# Patient Record
Sex: Female | Born: 1945 | Race: White | Hispanic: No
Health system: Southern US, Community
[De-identification: ages and names within clinical notes are randomized; demographics above are authoritative.]

## PROBLEM LIST (undated history)

## (undated) DIAGNOSIS — K219 Gastro-esophageal reflux disease without esophagitis: Secondary | ICD-10-CM

## (undated) DIAGNOSIS — F32A Depression, unspecified: Secondary | ICD-10-CM

## (undated) DIAGNOSIS — G4700 Insomnia, unspecified: Secondary | ICD-10-CM

## (undated) DIAGNOSIS — M159 Polyosteoarthritis, unspecified: Secondary | ICD-10-CM

## (undated) DIAGNOSIS — E7800 Pure hypercholesterolemia, unspecified: Secondary | ICD-10-CM

## (undated) DIAGNOSIS — N183 Chronic kidney disease, stage 3 unspecified: Secondary | ICD-10-CM

## (undated) DIAGNOSIS — R0989 Other specified symptoms and signs involving the circulatory and respiratory systems: Secondary | ICD-10-CM

## (undated) DIAGNOSIS — Z8619 Personal history of other infectious and parasitic diseases: Secondary | ICD-10-CM

## (undated) DIAGNOSIS — R600 Localized edema: Secondary | ICD-10-CM

## (undated) DIAGNOSIS — D509 Iron deficiency anemia, unspecified: Secondary | ICD-10-CM

## (undated) DIAGNOSIS — G894 Chronic pain syndrome: Secondary | ICD-10-CM

## (undated) DIAGNOSIS — R519 Headache, unspecified: Secondary | ICD-10-CM

## (undated) DIAGNOSIS — I10 Essential (primary) hypertension: Secondary | ICD-10-CM

## (undated) HISTORY — DX: Iron deficiency anemia, unspecified: D50.9

## (undated) HISTORY — DX: Essential (primary) hypertension: I10

## (undated) HISTORY — DX: Polyosteoarthritis, unspecified: M15.9

## (undated) HISTORY — DX: Insomnia, unspecified: G47.00

## (undated) HISTORY — DX: Depression, unspecified: F32.A

## (undated) HISTORY — DX: Headache, unspecified: R51.9

## (undated) HISTORY — DX: Other specified symptoms and signs involving the circulatory and respiratory systems: R09.89

## (undated) HISTORY — DX: Localized edema: R60.0

## (undated) HISTORY — DX: Personal history of other infectious and parasitic diseases: Z86.19

## (undated) HISTORY — PX: ESOPHAGOGASTRODUODENOSCOPY: SHX1529

## (undated) HISTORY — DX: Chronic kidney disease, stage 3 unspecified: N18.30

## (undated) HISTORY — DX: Pure hypercholesterolemia, unspecified: E78.00

## (undated) HISTORY — DX: Gastro-esophageal reflux disease without esophagitis: K21.9

## (undated) HISTORY — PX: OTHER SURGICAL HISTORY: SHX169

## (undated) HISTORY — DX: Chronic pain syndrome: G89.4

---

## 1984-11-06 HISTORY — PX: APPENDECTOMY: SHX54

## 2000-07-21 ENCOUNTER — Emergency Department (HOSPITAL_COMMUNITY): Admission: EM | Admit: 2000-07-21 | Discharge: 2000-07-21 | Payer: Self-pay | Admitting: *Deleted

## 2012-07-04 DIAGNOSIS — F419 Anxiety disorder, unspecified: Secondary | ICD-10-CM | POA: Insufficient documentation

## 2012-07-04 DIAGNOSIS — M549 Dorsalgia, unspecified: Secondary | ICD-10-CM | POA: Insufficient documentation

## 2012-07-04 DIAGNOSIS — D649 Anemia, unspecified: Secondary | ICD-10-CM | POA: Insufficient documentation

## 2012-07-04 DIAGNOSIS — J309 Allergic rhinitis, unspecified: Secondary | ICD-10-CM | POA: Insufficient documentation

## 2015-02-08 DIAGNOSIS — E785 Hyperlipidemia, unspecified: Secondary | ICD-10-CM | POA: Insufficient documentation

## 2016-07-20 DIAGNOSIS — Z79899 Other long term (current) drug therapy: Secondary | ICD-10-CM | POA: Insufficient documentation

## 2016-11-06 HISTORY — PX: COLONOSCOPY: SHX174

## 2019-10-25 DIAGNOSIS — M7551 Bursitis of right shoulder: Secondary | ICD-10-CM | POA: Insufficient documentation

## 2020-08-25 LAB — HEPATIC FUNCTION PANEL
ALT: 24 (ref 7–35)
AST: 21 (ref 13–35)
Alkaline Phosphatase: 78 (ref 25–125)
Bilirubin, Total: 0.3

## 2020-08-25 LAB — CBC AND DIFFERENTIAL
HCT: 41 (ref 36–46)
Hemoglobin: 13.4 (ref 12.0–16.0)
Platelets: 290 (ref 150–399)
WBC: 10.5

## 2020-08-25 LAB — LIPID PANEL
Cholesterol: 230 — AB (ref 0–200)
HDL: 61 (ref 35–70)
LDL Cholesterol: 149
Triglycerides: 112 (ref 40–160)

## 2020-08-25 LAB — BASIC METABOLIC PANEL
BUN: 12 (ref 4–21)
BUN: 12 (ref 4–21)
CO2: 27 — AB (ref 13–22)
Chloride: 101 (ref 99–108)
Creatinine: 1 (ref 0.5–1.1)
Creatinine: 1 (ref 0.5–1.1)
Glucose: 58
Glucose: 58
Potassium: 4 (ref 3.4–5.3)
Sodium: 143 (ref 137–147)

## 2020-08-25 LAB — COMPREHENSIVE METABOLIC PANEL
Albumin: 6.5 — AB (ref 3.5–5.0)
Calcium: 9.5 (ref 8.7–10.7)
GFR calc Af Amer: 68
GFR calc Af Amer: 68
GFR calc non Af Amer: 59
GFR calc non Af Amer: 59
Globulin: 2.5

## 2020-08-25 LAB — CBC: RBC: 4.47 (ref 3.87–5.11)

## 2020-09-30 DIAGNOSIS — E876 Hypokalemia: Secondary | ICD-10-CM | POA: Insufficient documentation

## 2020-09-30 DIAGNOSIS — N12 Tubulo-interstitial nephritis, not specified as acute or chronic: Secondary | ICD-10-CM | POA: Insufficient documentation

## 2020-09-30 DIAGNOSIS — R112 Nausea with vomiting, unspecified: Secondary | ICD-10-CM | POA: Insufficient documentation

## 2021-03-01 LAB — COMPREHENSIVE METABOLIC PANEL
Albumin: 4 (ref 3.5–5.0)
Calcium: 9.6 (ref 8.7–10.7)
GFR calc non Af Amer: 41
Globulin: 2.3

## 2021-03-01 LAB — CBC AND DIFFERENTIAL
HCT: 37 (ref 36–46)
Hemoglobin: 12.1 (ref 12.0–16.0)
Platelets: 271 (ref 150–399)
WBC: 9.6

## 2021-03-01 LAB — BASIC METABOLIC PANEL
BUN: 12 (ref 4–21)
CO2: 23 — AB (ref 13–22)
Chloride: 104 (ref 99–108)
Creatinine: 1.4 — AB (ref 0.5–1.1)
Glucose: 79
Potassium: 4 (ref 3.4–5.3)
Sodium: 141 (ref 137–147)

## 2021-03-01 LAB — HEPATIC FUNCTION PANEL
ALT: 19 (ref 7–35)
AST: 18 (ref 13–35)
Alkaline Phosphatase: 64 (ref 25–125)
Bilirubin, Total: 0.6

## 2021-03-01 LAB — CBC: RBC: 4.1 (ref 3.87–5.11)

## 2021-03-01 LAB — TSH: TSH: 2.1 (ref 0.41–5.90)

## 2021-09-27 ENCOUNTER — Other Ambulatory Visit: Payer: Self-pay

## 2021-09-27 DIAGNOSIS — G4700 Insomnia, unspecified: Secondary | ICD-10-CM | POA: Insufficient documentation

## 2021-09-27 DIAGNOSIS — I10 Essential (primary) hypertension: Secondary | ICD-10-CM | POA: Insufficient documentation

## 2021-09-28 ENCOUNTER — Encounter: Payer: Self-pay | Admitting: Family Medicine

## 2021-09-28 ENCOUNTER — Other Ambulatory Visit: Payer: Self-pay

## 2021-09-28 ENCOUNTER — Ambulatory Visit (INDEPENDENT_AMBULATORY_CARE_PROVIDER_SITE_OTHER): Payer: Medicare Other | Admitting: Family Medicine

## 2021-09-28 VITALS — BP 148/80 | HR 90 | Temp 97.9°F | Ht 60.5 in | Wt 144.8 lb

## 2021-09-28 DIAGNOSIS — E7800 Pure hypercholesterolemia, unspecified: Secondary | ICD-10-CM

## 2021-09-28 DIAGNOSIS — M48062 Spinal stenosis, lumbar region with neurogenic claudication: Secondary | ICD-10-CM

## 2021-09-28 DIAGNOSIS — G894 Chronic pain syndrome: Secondary | ICD-10-CM

## 2021-09-28 DIAGNOSIS — F411 Generalized anxiety disorder: Secondary | ICD-10-CM

## 2021-09-28 DIAGNOSIS — Z79899 Other long term (current) drug therapy: Secondary | ICD-10-CM | POA: Diagnosis not present

## 2021-09-28 DIAGNOSIS — I10 Essential (primary) hypertension: Secondary | ICD-10-CM

## 2021-09-28 DIAGNOSIS — D509 Iron deficiency anemia, unspecified: Secondary | ICD-10-CM

## 2021-09-28 DIAGNOSIS — N2889 Other specified disorders of kidney and ureter: Secondary | ICD-10-CM

## 2021-09-28 DIAGNOSIS — N183 Chronic kidney disease, stage 3 unspecified: Secondary | ICD-10-CM

## 2021-09-28 MED ORDER — ALPRAZOLAM 1 MG PO TABS: 1.0000 mg | ORAL_TABLET | Freq: Three times a day (TID) | ORAL | 5 refills | Status: DC

## 2021-09-28 MED ORDER — PANTOPRAZOLE SODIUM 40 MG PO TBEC: 40.0000 mg | DELAYED_RELEASE_TABLET | Freq: Every day | ORAL | 3 refills | Status: DC

## 2021-09-28 MED ORDER — HYDROCODONE-ACETAMINOPHEN 5-325 MG PO TABS
1.0000 | ORAL_TABLET | Freq: Two times a day (BID) | ORAL | 0 refills | Status: DC
Start: 2021-09-28 — End: 2021-10-19

## 2021-09-28 NOTE — Progress Notes (Signed)
Office Note 09/28/2021  CC:  Chief Complaint  Patient presents with   Establish Care    Previous PCP; Dr.Hollandsworth. Recent eye exam with Dr Anthony Sar at [facility] ([facility]). Last mammogram 2006   HPI:  Sheila Newman is a 75 y.o. female who is here to establish care and f/u HTN, HLD, CRI III, and anxiety. Patient's most recent primary MD: [facility]. Old records were reviewed prior to or during today's visit.  I reviewed patient's most recent follow-up visit with previous provider on 09/13/2021.  All was stable. Patient is on chronic opioid therapy as well as chronic benzodiazepine therapy.  Her previous primary care provider was prescribing these but it does state in the office note that he was leaving the practice as of 09/28/2021 so she sought another physician.  That provider stated that patient had no evidence of habituation, escalation of dose, or misuse of these medications.  No history of substance abuse. I reviewed her most recent labs which were done on 03/01/2021--TSH, cbc, cmet---and were all normal except creatinine 1.4. Most recent UDS 08/17/20-->positive opiates and benzos-->appropriate.  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 Medication and dose: vicodin 5/325, 1 bid # pills per month: 60 Opioid Treatment Agreement signed (Y/N): yes-today. Opioid Treatment Agreement last reviewed with patient:  today [place] reviewed this encounter (include red flags): Yes  PMP AWARE reviewed today: most recent rx for alprazolam was filled 08/29/89, # 90, rx by previous pcp. Vicodin rx last filled 09/01/21, #60.   Both rx'd by prior PCP Dr. Orlan Leavens No red flags.  Has been taking amlodipine 5 mg a day as well as a atorvastatin 10 mg a day.  She has no home blood pressure monitoring data today.  Records show/patient reports history of iron deficiency anemia.  Patient  reports full endoscopy work-up has not revealed any site of bleeding.  She states no Hemoccult testing was done.  I have no GI records at this time.  Certainly this could be deficiency due to malabsorption.  She has been taking four of the 65 mg iron tabs daily.  These do upset her stomach.  ROS as above, plus--> no fevers, no CP, no SOB, no wheezing, no cough, no dizziness, no HAs, no rashes, no melena/hematochezia.  No polyuria or polydipsia.   No focal weakness, paresthesias, or tremors.  No acute vision or hearing abnormalities.  No dysuria or unusual/new urinary urgency or frequency.  No recent changes in lower legs. No n/v/d or abd pain.  No palpitations.     Past Medical History:  Diagnosis Date   Anxiety and depression    Chronic pain syndrome    Dr. Francesco Runner   Chronic renal insufficiency, stage 3 (moderate) (HCC)    GFR 40sd   Frequent headaches    GERD (gastroesophageal reflux disease)    History of sepsis    UTI/admission   Hypercholesterolemia    Hypertension    Insomnia    Iron deficiency anemia    ?etiology.   Osteoarthritis, multiple sites     Past Surgical History:  Procedure Laterality Date   APPENDECTOMY  1986   COLONOSCOPY  2018   normal (dig [facility]a spec)   ESOPHAGOGASTRODUODENOSCOPY     neg for bleeding ?eval    Family History  Problem Relation Age of Onset   Arthritis Mother    Diabetes Mother    Heart attack Father  Diabetes Father    Arthritis Sister    COPD Sister    Diabetes Brother    Diabetes Brother    Diabetes Brother    Diabetes Brother    Diabetes Brother    Diabetes Brother     Social History   Socioeconomic History   Marital status: Married    Spouse name: Not on file   Number of children: Not on file   Years of education: Not on file   Highest education level: Not on file  Occupational History   Not on file  Tobacco Use   Smoking status: Never   Smokeless tobacco: Never  Substance and Sexual Activity   Alcohol use: Never    Drug use: Never   Sexual activity: Not on file  Other Topics Concern   Not on file  Social History Narrative   Widowed-->reMarried, 2 biologic, 2 adopted.   Educ: HS.  From [place]: [employer] x 40 yrs.  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   No T/A/Ds.   Social Determinants of Health   Financial Resource Strain: Not on file  Food Insecurity: Not on file  Transportation Needs: Not on file  Physical Activity: Not on file  Stress: Not on file  Social Connections: Not on file  Intimate Partner Violence: Not on file    Outpatient Encounter Medications as of 09/28/2021  Medication Sig   ALPRAZolam (XANAX) 1 MG tablet Take 1 mg by mouth 3 (three) times daily.   amLODipine (NORVASC) 5 MG tablet Take 1 tablet by mouth daily.   Ascorbic Acid (VITAMIN C PO) Take by mouth daily.   aspirin 81 MG chewable tablet Chew 81 mg by mouth daily in the afternoon.   atorvastatin (LIPITOR) 10 MG tablet Take 1 tablet by mouth daily.   Cholecalciferol (VITAMIN D3 PO) Take 50 mg by mouth daily.   Cyanocobalamin (VITAMIN B-12 PO) Take by mouth daily.   fluticasone (FLONASE) 50 MCG/ACT nasal spray See admin instructions.   gabapentin (NEURONTIN) 100 MG capsule Take 100 mg by mouth in the morning, at noon, in the evening, and at bedtime.   HYDROcodone-acetaminophen (NORCO/VICODIN) 5-325 MG tablet Take by mouth in the morning and at bedtime.   Iron (IRCON PO) Take 65 mg by mouth 4 (four) times daily.   meloxicam (MOBIC) 15 MG tablet Take 15 mg by mouth daily.   Omega-3 Fatty Acids (FISH OIL PO) Take by mouth daily.   pantoprazole (PROTONIX) 40 MG tablet Take 1 tablet by mouth daily.   traZODone (DESYREL) 50 MG tablet Take 50 mg by mouth daily.   VITAMIN E PO Take by mouth daily.   No facility-administered encounter medications on file as of 09/28/2021.    Allergies  Allergen Reactions   Tramadol Itching   ROS Review of Systems  Constitutional:  Negative for appetite change, chills,  fatigue and fever.  HENT:  Negative for congestion, dental problem, ear pain and sore throat.   Eyes:  Negative for discharge, redness and visual disturbance.  Respiratory:  Negative for cough, chest tightness, shortness of breath and wheezing.   Cardiovascular:  Negative for chest pain, palpitations and leg swelling.  Gastrointestinal:  Negative for abdominal pain, blood in stool, diarrhea, nausea and vomiting.  Genitourinary:  Negative for difficulty urinating, dysuria, flank pain, frequency, hematuria and urgency.  Musculoskeletal:  Positive for back pain, neck pain and neck stiffness. Negative for arthralgias, joint swelling and myalgias.  Skin:  Negative for pallor and rash.  Neurological:  Negative for dizziness, speech difficulty, weakness and headaches.  Hematological:  Negative for adenopathy. Does not bruise/bleed easily.  Psychiatric/Behavioral:  Negative for confusion and sleep disturbance. The patient is not nervous/anxious.    PE; Blood pressure (!) 148/80, pulse 90, temperature 97.9 F (36.6 C), temperature source Oral, height 5' 0.5" (1.537 m), weight 144 lb 12.8 oz (65.7 kg), last menstrual period 11/06/2001, SpO2 94 %.Body mass index is 27.81 kg/m.  Gen: Alert, well appearing.  Patient is oriented to person, place, time, and situation. AFFECT: pleasant, lucid thought and speech. CV: RRR, no m/r/g.   LUNGS: CTA bilat, nonlabored resps, good aeration in all lung fields. EXT: no clubbing or cyanosis.  Sock line pitting edema.   Pertinent labs:  Lab Results  Component Value Date   TSH 2.10 03/01/2021   Lab Results  Component Value Date   WBC 9.6 03/01/2021   HGB 12.1 03/01/2021   HCT 37 03/01/2021   PLT 271 03/01/2021   Lab Results  Component Value Date   CREATININE 1.4 (A) 03/01/2021   BUN 12 03/01/2021   NA 141 03/01/2021   K 4.0 03/01/2021   CL 104 03/01/2021   CO2 23 (A) 03/01/2021   Lab Results  Component Value Date   ALT 19 03/01/2021   AST 18  03/01/2021   ALKPHOS 64 03/01/2021   ASSESSMENT AND PLAN:   New patient, establishing care.  1.  Iron deficiency anemia.  Etiology unclear.  Will obtain past GI records.  Have her decrease her iron tab to one of the 65 mg tabs a day.  Plan recheck CBC and iron in 3 months.  Stop meloxicam. If iron still low at our recheck in 3 months will have to discuss iron infusions since she does not tolerate oral iron well.  #2 hypertension: Continue amlodipine 5 mg a day.  I encouraged her to get a blood pressure cuff for home and to check this daily. Reviewed normal blood pressure today with patient.  Return in 2 to 3 weeks to review numbers.  #3 hyperlipidemia.  She is on a atorvastatin 10 mg/day.  No past lipid numbers in records. She is not fasting at this time.  Plan repeat lipids at next follow-up.  #4 chronic pain syndrome.  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  Has been maintained long-term on Vicodin 5/325, 1 twice daily by her previous PCP. Will continue this.  Controlled substance contract done today.  Urine tox screen done today. I did do new prescription for Vicodin, #60. Stop meloxicam.    #5 chronic renal insufficiency stage III: Her old records are baseline creatinine is around 1.4. Stable on most recent labs done 03/01/2021.  Sounds like she has been on meloxicam daily so we discussed the potential risks of this medication with chronic renal insufficiency and decided to have her stop this today. BMET at follow-up in 2 to 3 weeks.  #6 anxiety, chronic.  Has been maintained long-term on alprazolam 1 mg 3 times daily. Will continue this.  Controlled substance contract done today, urine tox screen done today. Alprazolam 1 mg #90 refill x5 rx'd today.  An After Visit Summary was printed and given to the patient.  F/u: 2-3 wks to review bps  Signed:  Santiago Bumpers, MD            09/28/2021

## 2021-09-28 NOTE — Patient Instructions (Signed)
Decrease iron to 1 tab daily.  Stop meloxicam.   Get a blood pressure cuff at pharmacy (upper arm cuff). Check blood pressure and heart rate once a day and write numbers down to review with me in 2-3 wks.

## 2021-10-03 LAB — DRUG MONITORING PANEL 376104, URINE
Alphahydroxyalprazolam: 1071 ng/mL — ABNORMAL HIGH (ref <25)
Alphahydroxymidazolam: NEGATIVE ng/mL (ref <50)
Alphahydroxytriazolam: NEGATIVE ng/mL (ref <50)
Aminoclonazepam: NEGATIVE ng/mL (ref <25)
Amphetamine: NEGATIVE ng/mL (ref <250)
Amphetamines: NEGATIVE ng/mL (ref <500)
Barbiturates: NEGATIVE ng/mL (ref <300)
Benzodiazepines: POSITIVE ng/mL — AB (ref <100)
Cocaine Metabolite: NEGATIVE ng/mL (ref <150)
Codeine: NEGATIVE ng/mL (ref <50)
Desmethyltramadol: NEGATIVE ng/mL (ref <100)
Hydrocodone: 1395 ng/mL — ABNORMAL HIGH (ref <50)
Hydromorphone: 791 ng/mL — ABNORMAL HIGH (ref <50)
Hydroxyethylflurazepam: NEGATIVE ng/mL (ref <50)
Lorazepam: NEGATIVE ng/mL (ref <50)
Methamphetamine: NEGATIVE ng/mL (ref <250)
Morphine: NEGATIVE ng/mL (ref <50)
Nordiazepam: NEGATIVE ng/mL (ref <50)
Norhydrocodone: 2341 ng/mL — ABNORMAL HIGH (ref <50)
Opiates: POSITIVE ng/mL — AB (ref <100)
Oxazepam: NEGATIVE ng/mL (ref <50)
Oxycodone: NEGATIVE ng/mL (ref <100)
Temazepam: NEGATIVE ng/mL (ref <50)
Tramadol: NEGATIVE ng/mL (ref <100)

## 2021-10-03 LAB — DM TEMPLATE

## 2021-10-06 ENCOUNTER — Encounter: Payer: Self-pay | Admitting: Family Medicine

## 2021-10-19 ENCOUNTER — Ambulatory Visit (INDEPENDENT_AMBULATORY_CARE_PROVIDER_SITE_OTHER): Payer: Medicare Other | Admitting: Family Medicine

## 2021-10-19 ENCOUNTER — Other Ambulatory Visit: Payer: Self-pay

## 2021-10-19 ENCOUNTER — Encounter: Payer: Self-pay | Admitting: Family Medicine

## 2021-10-19 VITALS — BP 134/79 | HR 73 | Temp 97.5°F | Ht 60.5 in | Wt 150.0 lb

## 2021-10-19 DIAGNOSIS — M549 Dorsalgia, unspecified: Secondary | ICD-10-CM | POA: Diagnosis not present

## 2021-10-19 DIAGNOSIS — G8929 Other chronic pain: Secondary | ICD-10-CM

## 2021-10-19 DIAGNOSIS — E7800 Pure hypercholesterolemia, unspecified: Secondary | ICD-10-CM

## 2021-10-19 DIAGNOSIS — I10 Essential (primary) hypertension: Secondary | ICD-10-CM | POA: Diagnosis not present

## 2021-10-19 DIAGNOSIS — Z8639 Personal history of other endocrine, nutritional and metabolic disease: Secondary | ICD-10-CM | POA: Diagnosis not present

## 2021-10-19 DIAGNOSIS — M5441 Lumbago with sciatica, right side: Secondary | ICD-10-CM | POA: Diagnosis not present

## 2021-10-19 DIAGNOSIS — M5442 Lumbago with sciatica, left side: Secondary | ICD-10-CM

## 2021-10-19 DIAGNOSIS — M48062 Spinal stenosis, lumbar region with neurogenic claudication: Secondary | ICD-10-CM

## 2021-10-19 DIAGNOSIS — N183 Chronic kidney disease, stage 3 unspecified: Secondary | ICD-10-CM

## 2021-10-19 MED ORDER — AMLODIPINE BESYLATE 10 MG PO TABS: 10.0000 mg | ORAL_TABLET | Freq: Every day | ORAL | 0 refills | Status: DC

## 2021-10-19 MED ORDER — HYDROCODONE-ACETAMINOPHEN 5-325 MG PO TABS: 1.0000 | ORAL_TABLET | Freq: Two times a day (BID) | ORAL | 0 refills | Status: AC

## 2021-10-19 NOTE — Progress Notes (Addendum)
OFFICE VISIT  10/19/2021  CC:  Chief Complaint  Patient presents with   Follow-up    HTN; pt is fasting    HPI:    Patient is a 75 y.o. female who presents for 3 wk f/u HTN. A/P as of last visit: "1.  Iron deficiency anemia.  Etiology unclear.  Will obtain past GI records.  Have her decrease her iron tab to one of the 65 mg tabs a day.  Plan recheck CBC and iron in 3 months.  Stop meloxicam. If iron still low at our recheck in 3 months will have to discuss iron infusions since she does not tolerate oral iron well.   #2 hypertension: Continue amlodipine 5 mg a day.  I encouraged her to get a blood pressure cuff for home and to check this daily. Reviewed normal blood pressure today with patient.  Return in 2 to 3 weeks to review numbers.   #3 hyperlipidemia.  She is on a atorvastatin 10 mg/day.  No past lipid numbers in records. She is not fasting at this time.  Plan repeat lipids at next follow-up.   #4 chronic pain syndrome.  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  Has been maintained long-term on Vicodin 5/325, 1 twice daily by her previous PCP. Will continue this.  Controlled substance contract done today.  Urine tox screen done today. I did do new prescription for Vicodin, #60. Stop meloxicam.     #5 chronic renal insufficiency stage III: Her old records are baseline creatinine is around 1.4. Stable on most recent labs done 03/01/2021.  Sounds like she has been on meloxicam daily so we discussed the potential risks of this medication with chronic renal insufficiency and decided to have her stop this today. BMET at follow-up in 2 to 3 weeks.   #6 anxiety, chronic.  Has been maintained long-term on alprazolam 1 mg 3 times daily. Will continue this.  Controlled substance contract done today, urine tox screen done today. Alprazolam 1 mg #90 refill x5 rx'd today."  INTERIM HX: Home  blood pressures reviewed and averages 150-155/85.  Heart rate average in the 70s to 80s. Has had acute worsening of her mid and low back pain starting about 10 to 14 days ago.  She felt some acute pain 1 morning, which is not unusual for her, but this made her fall over and break at bedside lamp.  Since then it has hurt much more.  As per usual it goes all the way down both legs. Denies focal weakness. no loss of bowel or bladder control.   PMP AWARE reviewed today: most recent rx for vicodin  was filled 10/03/21, # 60, rx by me . No red flags.  ROS as above, plus--> no fevers, no CP, no SOB, no wheezing, no cough, no dizziness, no HAs, no rashes, no melena/hematochezia.  No polyuria or polydipsia.  No myalgias or arthralgias.  No focal weakness, paresthesias, or tremors.  No acute vision or hearing abnormalities.  No dysuria or unusual/new urinary urgency or frequency.  No recent changes in lower legs. No n/v/d or abd pain.  No palpitations.     Past Medical History:  Diagnosis Date   Anxiety and depression    Chronic pain syndrome    Dr. Laurian Brim   Chronic renal insufficiency, stage 3 (moderate) (HCC)    GFR 40sd   Frequent headaches    GERD (gastroesophageal reflux disease)  History of sepsis    UTI/admission   Hypercholesterolemia    Hypertension    Insomnia    Iron deficiency anemia    ?etiology.   Osteoarthritis, multiple sites     Past Surgical History:  Procedure Laterality Date   APPENDECTOMY  1986   COLONOSCOPY  2018   normal (dig [facility] spec per pt report but that office has no records of her seeing them)   ESOPHAGOGASTRODUODENOSCOPY     neg for bleeding ?eval    Outpatient Medications Prior to Visit  Medication Sig Dispense Refill   ALPRAZolam (XANAX) 1 MG tablet Take 1 tablet (1 mg total) by mouth 3 (three) times daily. 90 tablet 5   Ascorbic Acid (VITAMIN C PO) Take by mouth daily.     aspirin 81 MG chewable tablet Chew 81 mg by mouth daily in the afternoon.      atorvastatin (LIPITOR) 10 MG tablet Take 1 tablet by mouth daily.     Cholecalciferol (VITAMIN D3 PO) Take 50 mg by mouth daily.     Cyanocobalamin (VITAMIN B-12 PO) Take by mouth daily.     fluticasone (FLONASE) 50 MCG/ACT nasal spray Place 1 spray into both nostrils daily.     gabapentin (NEURONTIN) 100 MG capsule Take 100 mg by mouth in the morning, at noon, in the evening, and at bedtime.     Iron (IRCON PO) Take 65 mg by mouth daily.     Omega-3 Fatty Acids (FISH OIL PO) Take by mouth daily.     pantoprazole (PROTONIX) 40 MG tablet Take 1 tablet (40 mg total) by mouth daily. Take 1 tablet by mouth daily. 90 tablet 3   traZODone (DESYREL) 50 MG tablet Take 50 mg by mouth daily.     VITAMIN E PO Take by mouth daily.     amLODipine (NORVASC) 5 MG tablet Take 1 tablet by mouth daily.     HYDROcodone-acetaminophen (NORCO/VICODIN) 5-325 MG tablet Take 1 tablet by mouth in the morning and at bedtime. Take by mouth in the morning and at bedtime. 60 tablet 0   No facility-administered medications prior to visit.    Allergies  Allergen Reactions   Tramadol Itching    ROS As per HPI  PE: Vitals with BMI 10/19/2021 09/28/2021  Height 5' 0.5" 5' 0.5"  Weight 150 lbs 144 lbs 13 oz  BMI 0000000 0000000  Systolic [id number] 123456  Diastolic 79 80  Pulse 73 90     Physical Exam  Gen: Alert, well appearing.  Patient is oriented to person, place, time, and situation. AFFECT: pleasant, lucid thought and speech. CV: RRR, soft systolic flow murmur over aortic area.  No r/g.   LUNGS: CTA bilat, nonlabored resps, good aeration in all lung fields. EXT: no clubbing or cyanosis.  1-2+ bilat LL pitting edema.  Mild focal midline tenderness to palpation over mid and distal T-spine as well as proximal L-spine.  LABS:  Last CBC Lab Results  Component Value Date   WBC 9.6 03/01/2021   HGB 12.1 03/01/2021   HCT 37 03/01/2021   PLT 271 [id number]   Last metabolic panel Lab Results  Component Value Date    NA 141 03/01/2021   K 4.0 03/01/2021   CL 104 03/01/2021   CO2 23 (A) 03/01/2021   BUN 12 03/01/2021   CREATININE 1.4 (A) 03/01/2021   GFRNONAA 41 03/01/2021   CALCIUM 9.6 03/01/2021   ALBUMIN 4.0 03/01/2021   ALKPHOS 64 03/01/2021   AST 18 03/01/2021  ALT 19 03/01/2021   Last lipids Lab Results  Component Value Date   CHOL 230 (A) 08/25/2020   HDL 61 08/25/2020   LDLCALC 149 08/25/2020   TRIG 112 08/25/2020   Last hemoglobin A1c No results found for: HGBA1C Last thyroid functions Lab Results  Component Value Date   TSH 2.10 03/01/2021    IMPRESSION AND PLAN:  #1 uncontrolled hypertension.  Increase amlodipine to 10 mg a day.  Continue home blood pressure and heart rate monitoring.  Checking electrolytes and creatinine today.  2.  Acute on chronic back pain.  Recent fall.  History of spinal stenosis with neurogenic claudication.  Patient was told by specialist that surgery was not an option.  Saw Dr. Francesco Runner in the remote past for back injections but this did not help.  PCP had prescribed her Vicodin in the past and she has consistently taken this twice a day for years.  We will continue this, as it seems to still be helping adequately even in the face of recent acute pain.  No NSAIDs. Thoracic and lumbosacral plain films ordered today.  3. Iron deficiency anemia.  Etiology unclear.  [facility] had no records on her. Last hb in old records was 12.04 February 2021. I decreased her iron to one tab a day 2 wks ago. Recheck iron panel and Hb today.  4. hyperlipidemia.  She is on a atorvastatin 10 mg/day.  No past lipid numbers in records. Fasting lipid panel today.  An After Visit Summary was printed and given to the patient.  FOLLOW UP: Return in about 2 weeks (around 11/02/2021) for f/u HTN and back pain.  Signed:  Crissie Sickles, MD           10/19/2021

## 2021-10-20 ENCOUNTER — Other Ambulatory Visit: Payer: Self-pay | Admitting: Family Medicine

## 2021-10-20 ENCOUNTER — Ambulatory Visit (INDEPENDENT_AMBULATORY_CARE_PROVIDER_SITE_OTHER): Payer: Medicare Other

## 2021-10-20 DIAGNOSIS — M5441 Lumbago with sciatica, right side: Secondary | ICD-10-CM | POA: Diagnosis not present

## 2021-10-20 DIAGNOSIS — G8929 Other chronic pain: Secondary | ICD-10-CM | POA: Diagnosis not present

## 2021-10-20 DIAGNOSIS — M5442 Lumbago with sciatica, left side: Secondary | ICD-10-CM | POA: Diagnosis not present

## 2021-10-20 DIAGNOSIS — M48062 Spinal stenosis, lumbar region with neurogenic claudication: Secondary | ICD-10-CM

## 2021-10-20 DIAGNOSIS — M546 Pain in thoracic spine: Secondary | ICD-10-CM | POA: Diagnosis not present

## 2021-10-20 DIAGNOSIS — K449 Diaphragmatic hernia without obstruction or gangrene: Secondary | ICD-10-CM | POA: Diagnosis not present

## 2021-10-20 DIAGNOSIS — M545 Low back pain, unspecified: Secondary | ICD-10-CM | POA: Diagnosis not present

## 2021-10-20 LAB — IRON,TIBC AND FERRITIN PANEL
%SAT: 24 % (calc) (ref 16–45)
Ferritin: 53 ng/mL (ref 16–288)
Iron: 60 ug/dL (ref 45–160)
TIBC: 252 mcg/dL (calc) (ref 250–450)

## 2021-10-20 LAB — CBC WITH DIFFERENTIAL/PLATELET
Basophils Absolute: 0.1 10*3/uL (ref 0.0–0.1)
Basophils Relative: 1.2 % (ref 0.0–3.0)
Eosinophils Absolute: 0.2 10*3/uL (ref 0.0–0.7)
Eosinophils Relative: 2.7 % (ref 0.0–5.0)
HCT: 36.1 % (ref 36.0–46.0)
Hemoglobin: 11.8 g/dL — ABNORMAL LOW (ref 12.0–15.0)
Lymphocytes Relative: 38.8 % (ref 12.0–46.0)
Lymphs Abs: 2.4 10*3/uL (ref 0.7–4.0)
MCHC: 32.6 g/dL (ref 30.0–36.0)
MCV: 88.1 fl (ref 78.0–100.0)
Monocytes Absolute: 0.5 10*3/uL (ref 0.1–1.0)
Monocytes Relative: 7.6 % (ref 3.0–12.0)
Neutro Abs: 3.1 10*3/uL (ref 1.4–7.7)
Neutrophils Relative %: 49.7 % (ref 43.0–77.0)
Platelets: 248 10*3/uL (ref 150.0–400.0)
RBC: 4.1 Mil/uL (ref 3.87–5.11)
RDW: 13.7 % (ref 11.5–15.5)
WBC: 6.2 10*3/uL (ref 4.0–10.5)

## 2021-10-20 LAB — BASIC METABOLIC PANEL
BUN: 13 mg/dL (ref 6–23)
CO2: 32 mEq/L (ref 19–32)
Calcium: 9.3 mg/dL (ref 8.4–10.5)
Chloride: 102 mEq/L (ref 96–112)
Creatinine, Ser: 0.99 mg/dL (ref 0.40–1.20)
GFR: 56.41 mL/min — ABNORMAL LOW (ref >60.00)
Glucose, Bld: 85 mg/dL (ref 70–99)
Potassium: 4 mEq/L (ref 3.5–5.1)
Sodium: 139 mEq/L (ref 135–145)

## 2021-10-20 LAB — LIPID PANEL
Cholesterol: 193 mg/dL (ref 0–200)
HDL: 56.6 mg/dL (ref >39.00)
LDL Cholesterol: 116 mg/dL — ABNORMAL HIGH (ref 0–99)
NonHDL: 136.02
Total CHOL/HDL Ratio: 3
Triglycerides: 101 mg/dL (ref 0.0–149.0)
VLDL: 20.2 mg/dL (ref 0.0–40.0)

## 2021-10-21 ENCOUNTER — Telehealth: Payer: Self-pay

## 2021-10-21 MED ORDER — ATORVASTATIN CALCIUM 20 MG PO TABS: 20.0000 mg | ORAL_TABLET | Freq: Every day | ORAL | 2 refills | Status: DC

## 2021-10-21 NOTE — Telephone Encounter (Signed)
-----   Message from Jeoffrey Massed, MD sent at 10/20/2021  5:25 PM EST ----- All labs excellent except LDL cholesterol ("bad" cholesterol) is 116 and I recommend we get this down to <100.  Increase atorvastatin to 20mg  daily, #30, rf x 2. We'll recheck lab in a few months.

## 2021-10-31 DIAGNOSIS — Z885 Allergy status to narcotic agent status: Secondary | ICD-10-CM | POA: Diagnosis not present

## 2021-10-31 DIAGNOSIS — I10 Essential (primary) hypertension: Secondary | ICD-10-CM | POA: Diagnosis not present

## 2021-10-31 DIAGNOSIS — R600 Localized edema: Secondary | ICD-10-CM | POA: Diagnosis not present

## 2021-10-31 DIAGNOSIS — Z79899 Other long term (current) drug therapy: Secondary | ICD-10-CM | POA: Diagnosis not present

## 2021-10-31 DIAGNOSIS — M1990 Unspecified osteoarthritis, unspecified site: Secondary | ICD-10-CM | POA: Diagnosis not present

## 2021-10-31 DIAGNOSIS — Z7982 Long term (current) use of aspirin: Secondary | ICD-10-CM | POA: Diagnosis not present

## 2021-10-31 DIAGNOSIS — D649 Anemia, unspecified: Secondary | ICD-10-CM | POA: Diagnosis not present

## 2021-10-31 DIAGNOSIS — M7989 Other specified soft tissue disorders: Secondary | ICD-10-CM | POA: Diagnosis not present

## 2021-11-01 ENCOUNTER — Telehealth: Payer: Self-pay

## 2021-11-01 NOTE — Telephone Encounter (Signed)
Patient refill request.  [facility]  HYDROcodone-acetaminophen (NORCO/VICODIN) 5-325 MG tablet [[id number]]

## 2021-11-01 NOTE — Telephone Encounter (Signed)
Pt advised refill on hold with [facility]. She will contact the pharmacy

## 2021-11-09 ENCOUNTER — Encounter: Payer: Self-pay | Admitting: Family Medicine

## 2021-11-09 ENCOUNTER — Other Ambulatory Visit: Payer: Self-pay

## 2021-11-09 ENCOUNTER — Ambulatory Visit (INDEPENDENT_AMBULATORY_CARE_PROVIDER_SITE_OTHER): Payer: Medicare Other | Admitting: Family Medicine

## 2021-11-09 VITALS — BP 124/73 | HR 78 | Temp 97.7°F | Ht 60.5 in | Wt 148.1 lb

## 2021-11-09 DIAGNOSIS — R600 Localized edema: Secondary | ICD-10-CM

## 2021-11-09 DIAGNOSIS — G8929 Other chronic pain: Secondary | ICD-10-CM | POA: Diagnosis not present

## 2021-11-09 DIAGNOSIS — M5442 Lumbago with sciatica, left side: Secondary | ICD-10-CM

## 2021-11-09 DIAGNOSIS — I10 Essential (primary) hypertension: Secondary | ICD-10-CM | POA: Diagnosis not present

## 2021-11-09 DIAGNOSIS — M5441 Lumbago with sciatica, right side: Secondary | ICD-10-CM | POA: Diagnosis not present

## 2021-11-09 DIAGNOSIS — E7800 Pure hypercholesterolemia, unspecified: Secondary | ICD-10-CM | POA: Diagnosis not present

## 2021-11-09 MED ORDER — AMLODIPINE BESYLATE 10 MG PO TABS: 10.0000 mg | ORAL_TABLET | Freq: Every day | ORAL | 3 refills | Status: DC

## 2021-11-09 MED ORDER — METOPROLOL TARTRATE 25 MG PO TABS: ORAL_TABLET | ORAL | 0 refills | Status: DC

## 2021-11-09 NOTE — Progress Notes (Signed)
OFFICE VISIT  11/09/2021  CC:  Chief Complaint  Patient presents with   Follow-up    Hypertension, back pain.    HPI:    Patient is a 76 y.o. female who presents for 3 wk f/u uncontrolled HTN and back pain. A/P as of last visit: "#1 uncontrolled hypertension.  Increase amlodipine to 10 mg a day.  Continue home blood pressure and heart rate monitoring.  Checking electrolytes and creatinine today.  2.  Acute on chronic back pain.  Recent fall.  History of spinal stenosis with neurogenic claudication.  Patient was told by specialist that surgery was not an option.  Saw Dr. Francesco Runner in the remote past for back injections but this did not help.  PCP had prescribed her Vicodin in the past and she has consistently taken this twice a day for years.  We will continue this, as it seems to still be helping adequately even in the face of recent acute pain.  No NSAIDs. Thoracic and lumbosacral plain films ordered today.   3. Iron deficiency anemia.  Etiology unclear.  [facility] had no records on her. Last hb in old records was 12.04 February 2021. I decreased her iron to one tab a day 2 wks ago. Recheck iron panel and Hb today.   4. hyperlipidemia.  She is on a atorvastatin 10 mg/day.  No past lipid numbers in records. Fasting lipid panel today."  INTERIM HX: Lipids elevated last visit so I increased atorvastatin to 20mg  daily.  Home blood pressures reviewed today: Average about 145/75. Range 130-165/65-97.  Heart rate average about 90. Taking amlodipine 10 mg/day.  She went to [facility] emergency department on 10/31/2021 for bilateral lower extremity swelling.  This was a little asymmetric, right greater than left so they did a venous Doppler to rule out DVT--this was negative for DVT.  General instructions on minimizing lower extremity edema were given.  No new medications. She has started to watch her sodium intake better, has started wearing compression hose, and has noted nearly complete  resolution of her swelling. No pain in her legs.  Says her chronic low back pain has been better lately.  Past Medical History:  Diagnosis Date   Anxiety and depression    Chronic pain syndrome    Dr. Francesco Runner   Chronic renal insufficiency, stage 3 (moderate) (HCC)    GFR 40sd   Frequent headaches    GERD (gastroesophageal reflux disease)    History of sepsis    UTI/admission   Hypercholesterolemia    Hypertension    Insomnia    Iron deficiency anemia    ?etiology.   Osteoarthritis, multiple sites     Past Surgical History:  Procedure Laterality Date   APPENDECTOMY  1986   COLONOSCOPY  2018   normal ([facility] per pt report but that office has no records of her seeing them)   ESOPHAGOGASTRODUODENOSCOPY     neg for bleeding ?eval    Outpatient Medications Prior to Visit  Medication Sig Dispense Refill   ALPRAZolam (XANAX) 1 MG tablet Take 1 tablet (1 mg total) by mouth 3 (three) times daily. 90 tablet 5   Ascorbic Acid (VITAMIN C PO) Take by mouth daily.     aspirin 81 MG chewable tablet Chew 81 mg by mouth daily in the afternoon.     atorvastatin (LIPITOR) 20 MG tablet Take 1 tablet (20 mg total) by mouth daily. 30 tablet 2   Cholecalciferol (VITAMIN D3 PO) Take 50 mg by mouth  daily.     Cyanocobalamin (VITAMIN B-12 PO) Take by mouth daily.     fluticasone (FLONASE) 50 MCG/ACT nasal spray Place 1 spray into both nostrils daily.     gabapentin (NEURONTIN) 100 MG capsule Take 100 mg by mouth in the morning, at noon, in the evening, and at bedtime.     HYDROcodone-acetaminophen (NORCO/VICODIN) 5-325 MG tablet Take 1 tablet by mouth in the morning and at bedtime. Take by mouth in the morning and at bedtime. 60 tablet 0   Iron (IRCON PO) Take 65 mg by mouth daily.     Omega-3 Fatty Acids (FISH OIL PO) Take by mouth daily.     pantoprazole (PROTONIX) 40 MG tablet Take 1 tablet (40 mg total) by mouth daily. Take 1 tablet by mouth daily. 90 tablet 3   traZODone (DESYREL) 50  MG tablet Take 50 mg by mouth daily.     VITAMIN E PO Take by mouth daily.     amLODipine (NORVASC) 10 MG tablet Take 1 tablet (10 mg total) by mouth daily. 30 tablet 0   No facility-administered medications prior to visit.    Allergies  Allergen Reactions   Tramadol Itching    ROS As per HPI  PE: Vitals with BMI 11/09/2021 10/19/2021 09/28/2021  Height 5' 0.5" 5' 0.5" 5' 0.5"  Weight 148 lbs 2 oz 150 lbs 144 lbs 13 oz  BMI 28.44 0000000 0000000  Systolic A999333 [id number] [id number]  Diastolic 73 79 80  Pulse 78 73 90    Physical Exam  Gen: Alert, well appearing.  Patient is oriented to person, place, time, and situation. AFFECT: pleasant, lucid thought and speech. CV: RRR, no m/r/g.   LUNGS: CTA bilat, nonlabored resps, good aeration in all lung fields. EXT: no clubbing or cyanosis.  1+ R LL pitting edema and trace L LE pitting edema.    LABS:  Last CBC Lab Results  Component Value Date   WBC 6.2 10/19/2021   HGB 11.8 (L) 10/19/2021   HCT 36.1 10/19/2021   MCV 88.1 10/19/2021   RDW 13.7 10/19/2021   PLT 248.0 10/19/2021   Lab Results  Component Value Date   IRON 60 10/19/2021   TIBC 252 10/19/2021   FERRITIN 53 Q000111Q   Last metabolic panel Lab Results  Component Value Date   GLUCOSE 85 10/19/2021   NA 139 10/19/2021   K 4.0 10/19/2021   CL 102 10/19/2021   CO2 32 10/19/2021   BUN 13 10/19/2021   CREATININE 0.99 10/19/2021   GFRNONAA 41 03/01/2021   CALCIUM 9.3 10/19/2021   ALBUMIN 4.0 03/01/2021   ALKPHOS 64 03/01/2021   AST 18 03/01/2021   ALT 19 03/01/2021   IMPRESSION AND PLAN:  #1 uncontrolled hypertension.  Add Lopressor 25 mg twice a day. Continue amlodipine 10 mg a day.  2.  Bilateral lower extremity edema.  Suspect this was largely due to me increasing her amlodipine at last visit. This has nearly completely resolved.  Continue sodium restriction, wear compression stockings, and elevate legs as needed.  #3 hyperlipidemia. Increased her atorvastatin  to 20 mg a couple weeks ago.  Plan recheck lipid panel in 2 to 3 months.  4.  Chronic bilateral low back pain with bilateral sciatica. Doing a little better lately.  However she requests a TENS unit so I did prescribe this.  An After Visit Summary was printed and given to the patient.  FOLLOW UP: Return for 3-4 wks f/u HTN.  Signed:  Abbe Amsterdam  Rhegan Trunnell, MD           11/09/2021

## 2021-11-27 ENCOUNTER — Other Ambulatory Visit: Payer: Self-pay | Admitting: Family Medicine

## 2021-11-28 NOTE — Telephone Encounter (Signed)
Pt has upcoming appt 1/25

## 2021-11-30 ENCOUNTER — Encounter: Payer: Self-pay | Admitting: Family Medicine

## 2021-11-30 ENCOUNTER — Other Ambulatory Visit: Payer: Self-pay

## 2021-11-30 ENCOUNTER — Ambulatory Visit (INDEPENDENT_AMBULATORY_CARE_PROVIDER_SITE_OTHER): Payer: Medicare Other | Admitting: Family Medicine

## 2021-11-30 VITALS — BP 123/69 | HR 78 | Temp 97.7°F | Ht 60.5 in | Wt 146.8 lb

## 2021-11-30 DIAGNOSIS — I10 Essential (primary) hypertension: Secondary | ICD-10-CM | POA: Diagnosis not present

## 2021-11-30 DIAGNOSIS — M79674 Pain in right toe(s): Secondary | ICD-10-CM

## 2021-11-30 DIAGNOSIS — M7751 Other enthesopathy of right foot: Secondary | ICD-10-CM | POA: Diagnosis not present

## 2021-11-30 MED ORDER — MELOXICAM 15 MG PO TABS: 15.0000 mg | ORAL_TABLET | Freq: Every day | ORAL | 0 refills | Status: DC

## 2021-11-30 NOTE — Progress Notes (Signed)
OFFICE VISIT  12/04/2021  CC: f/u HTN  HPI:    Patient is a 76 y.o. female who presents for 3-week follow-up uncontrolled hypertension. A/P as of last visit: "#1 uncontrolled hypertension.  Add Lopressor 25 mg twice a day. Continue amlodipine 10 mg a day.  2.  Bilateral lower extremity edema.  Suspect this was largely due to me increasing her amlodipine at last visit. This has nearly completely resolved.  Continue sodium restriction, wear compression stockings, and elevate legs as needed.   #3 hyperlipidemia. Increased her atorvastatin to 20 mg a couple weeks ago.  Plan recheck lipid panel in 2 to 3 months.  4.  Chronic bilateral low back pain with bilateral sciatica. Doing a little better lately.  However she requests a TENS unit so I did prescribe this."  INTERIM HX: Home bp's 130/80 or better. No probs with meds. Compression stockings have been helping her LE edema.  One week hx of bottom of R foot ---focal over the distal 2nd metatarsal. No injury.  No recent overuse Has never has this pain before. She does have some hammertoe changes and significant hallux valgus on the right side.  Lower extremity swelling stable/improved with compression stockings.  Past Medical History:  Diagnosis Date   Anxiety and depression    Chronic pain syndrome    Dr. Francesco Runner   Chronic renal insufficiency, stage 3 (moderate) (HCC)    GFR 40sd   Frequent headaches    GERD (gastroesophageal reflux disease)    History of sepsis    UTI/admission   Hypercholesterolemia    Hypertension    Insomnia    Iron deficiency anemia    ?etiology.   Osteoarthritis, multiple sites     Past Surgical History:  Procedure Laterality Date   APPENDECTOMY  1986   COLONOSCOPY  2018   normal ([facility] spec per pt report but that office has no records of her seeing them)   ESOPHAGOGASTRODUODENOSCOPY     neg for bleeding ?eval    Outpatient Medications Prior to Visit  Medication Sig Dispense Refill    ALPRAZolam (XANAX) 1 MG tablet Take 1 tablet (1 mg total) by mouth 3 (three) times daily. 90 tablet 5   amLODipine (NORVASC) 10 MG tablet Take 1 tablet (10 mg total) by mouth daily. 90 tablet 3   Ascorbic Acid (VITAMIN C PO) Take by mouth daily.     aspirin 81 MG chewable tablet Chew 81 mg by mouth daily in the afternoon.     Cholecalciferol (VITAMIN D3 PO) Take 50 mg by mouth daily.     Cyanocobalamin (VITAMIN B-12 PO) Take by mouth daily.     fluticasone (FLONASE) 50 MCG/ACT nasal spray Place 1 spray into both nostrils daily.     gabapentin (NEURONTIN) 100 MG capsule Take 100 mg by mouth in the morning, at noon, in the evening, and at bedtime.     HYDROcodone-acetaminophen (NORCO/VICODIN) 5-325 MG tablet Take 1 tablet by mouth every 6 (six) hours as needed for moderate pain.     Iron (IRCON PO) Take 65 mg by mouth daily.     Omega-3 Fatty Acids (FISH OIL PO) Take by mouth daily.     pantoprazole (PROTONIX) 40 MG tablet Take 1 tablet (40 mg total) by mouth daily. Take 1 tablet by mouth daily. 90 tablet 3   traZODone (DESYREL) 50 MG tablet Take 50 mg by mouth daily.     VITAMIN E PO Take by mouth daily.     atorvastatin (  LIPITOR) 20 MG tablet Take 1 tablet (20 mg total) by mouth daily. 30 tablet 2   metoprolol tartrate (LOPRESSOR) 25 MG tablet 1 tab po bid 60 tablet 0   No facility-administered medications prior to visit.    Allergies  Allergen Reactions   Tramadol Itching    ROS As per HPI  PE: Vitals with BMI 11/30/2021 11/09/2021 10/19/2021  Height 5' 0.5" 5' 0.5" 5' 0.5"  Weight 146 lbs 13 oz 148 lbs 2 oz 150 lbs  BMI 28.19 99991111 0000000  Systolic [id number] A999333 Q000111Q  Diastolic 69 73 79  Pulse 78 78 73     Physical Exam  General: Alert and well-appearing Affect: Anxious but pleasant, thought and speech lucid. Right foot: Hallux valgus deformity as well as mild hammertoe deformity of digits 2 3 and 4 on the right foot. Significant tenderness to palpation at distal second metatarsal  as well as MTP here.  No swelling or erythema.  LABS:  Last CBC Lab Results  Component Value Date   WBC 6.2 10/19/2021   HGB 11.8 (L) 10/19/2021   HCT 36.1 10/19/2021   MCV 88.1 10/19/2021   RDW 13.7 10/19/2021   PLT 248.0 10/19/2021   Lab Results  Component Value Date   IRON 60 10/19/2021   TIBC 252 10/19/2021   FERRITIN 53 Q000111Q   Last metabolic panel Lab Results  Component Value Date   GLUCOSE 85 10/19/2021   NA 139 10/19/2021   K 4.0 10/19/2021   CL 102 10/19/2021   CO2 32 10/19/2021   BUN 13 10/19/2021   CREATININE 0.99 10/19/2021   GFRNONAA 41 03/01/2021   CALCIUM 9.3 10/19/2021   ALBUMIN 4.0 03/01/2021   ALKPHOS 64 03/01/2021   AST 18 03/01/2021   ALT 19 03/01/2021   IMPRESSION AND PLAN:  #1 essential hypertension.  Now well controlled. Continue Lopressor 25 mg twice daily and amlodipine 10 mg daily.  2.  Right second toe pain.  Suspect second MTP capsulitis.  Suspect related to her acquired foot and toes deformity.   We will do trial of meloxicam 15 mg a day, ice, metatarsal pad. We will see how she is doing with this in 7 to 10 days and if no significant improvement at all then we will discuss possible steroid injection into the second MTP joint right foot.  #3 hyperlipidemia. Increased her atorvastatin to 20 mg about 5 wks ago.  Plan recheck lipid panel in 2 to 3 months.  An After Visit Summary was printed and given to the patient.  FOLLOW UP: Return in about 1 week (around 12/07/2021) for f/u R foot pain.  Signed:  Crissie Sickles, MD           12/04/2021

## 2021-11-30 NOTE — Patient Instructions (Signed)
Buy a metatarsal pad at the pharmacy---wear in R shoe.  Ice the area of pain on your foot for 20 minutes at least once a day.

## 2021-12-02 ENCOUNTER — Telehealth: Payer: Self-pay | Admitting: Family Medicine

## 2021-12-02 NOTE — Telephone Encounter (Signed)
Pt called and said she wants to get a refill on the 5mg  HYDROcodone, [facility]

## 2021-12-04 ENCOUNTER — Encounter: Payer: Self-pay | Admitting: Family Medicine

## 2021-12-05 ENCOUNTER — Other Ambulatory Visit: Payer: Self-pay | Admitting: Family Medicine

## 2021-12-05 MED ORDER — TRAZODONE HCL 50 MG PO TABS: 50.0000 mg | ORAL_TABLET | Freq: Every day | ORAL | 3 refills | Status: DC

## 2021-12-05 MED ORDER — HYDROCODONE-ACETAMINOPHEN 5-325 MG PO TABS: ORAL_TABLET | ORAL | 0 refills | Status: DC

## 2021-12-05 NOTE — Telephone Encounter (Signed)
Pt med refill  HYDROcodone-acetaminophen HYDROcodone-acetaminophen (NORCO/VICODIN) 5-325 MG tablet   traZODone traZODone (DESYREL) 50 MG tablet   [id number] [place] [place] - [address] AT [address] Phone:  [phone]  Fax:  [phone]

## 2021-12-05 NOTE — Telephone Encounter (Signed)
LM for pt to return call to discuss.  

## 2021-12-05 NOTE — Telephone Encounter (Signed)
Please see other message regarding refills.

## 2021-12-05 NOTE — Telephone Encounter (Signed)
Prescriptions sent

## 2021-12-05 NOTE — Telephone Encounter (Signed)
Spoke with pt regarding results/recommendations,voiced understanding. ? ?

## 2021-12-05 NOTE — Telephone Encounter (Signed)
Requesting: Norco Contract: N/A UDS: N/A Last Visit:11/30/21  Next Visit: 12/07/21 Last Refill: N/A  RF request for trazodone LOV: 11/30/21 Next ov: 12/07/21 Last written: N/A   Please review and advise. Meds pending

## 2021-12-06 ENCOUNTER — Other Ambulatory Visit: Payer: Self-pay

## 2021-12-06 NOTE — Telephone Encounter (Signed)
LM for pt regarding medication ?

## 2021-12-07 ENCOUNTER — Ambulatory Visit (INDEPENDENT_AMBULATORY_CARE_PROVIDER_SITE_OTHER): Payer: Medicare Other | Admitting: Family Medicine

## 2021-12-07 ENCOUNTER — Ambulatory Visit (INDEPENDENT_AMBULATORY_CARE_PROVIDER_SITE_OTHER): Payer: Medicare Other

## 2021-12-07 ENCOUNTER — Encounter: Payer: Self-pay | Admitting: Family Medicine

## 2021-12-07 VITALS — BP 114/65 | HR 50 | Temp 97.7°F | Ht 60.5 in | Wt 148.8 lb

## 2021-12-07 DIAGNOSIS — M7751 Other enthesopathy of right foot: Secondary | ICD-10-CM

## 2021-12-07 DIAGNOSIS — M79671 Pain in right foot: Secondary | ICD-10-CM

## 2021-12-07 MED ORDER — TRIAMCINOLONE ACETONIDE 40 MG/ML IJ SUSP
20.0000 mg | Freq: Once | INTRAMUSCULAR | Status: AC
  Administered 2021-12-07: 20 mg via INTRAMUSCULAR

## 2021-12-07 NOTE — Progress Notes (Signed)
OFFICE VISIT  12/07/2021  CC:  Chief Complaint  Patient presents with   Foot Pain    R, still having pain,   HPI:    Patient is a 76 y.o. female who presents for 1 wk f/u R foot/toe pain. A/P as of last visit: "#1 essential hypertension.  Now well controlled. Continue Lopressor 25 mg twice daily and amlodipine 10 mg daily.  2.  Right second toe pain.  Suspect second MTP capsulitis.  Suspect related to her acquired foot and toes deformity.   We will do trial of meloxicam 15 mg a day, ice, metatarsal pad. We will see how she is doing with this in 7 to 10 days and if no significant improvement at all then we will discuss possible steroid injection into the second MTP joint right foot.   #3 hyperlipidemia. Increased her atorvastatin to 20 mg about 5 wks ago.  Plan recheck lipid panel in 2 to 3 months."  INTERIM HX: Pain the same, now extending up into more prox region of foot--"feels deep".  Hurts with and w/out wt bearing.  Has been hurting like this about 3 wks now. Taking melox and wearing metatarsal pad as instructed. 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 Scheduled to be working much less tomorrow, none the next day.  No redness or swelling.  Past Medical History:  Diagnosis Date   Anxiety and depression    Bilateral edema of lower extremity    Chronic pain syndrome    Dr. Francesco Runner   Chronic renal insufficiency, stage 3 (moderate) (HCC)    GFR 40sd   Frequent headaches    GERD (gastroesophageal reflux disease)    History of sepsis    UTI/admission   Hypercholesterolemia    Hypertension    Insomnia    Iron deficiency anemia    ?etiology.   Osteoarthritis, multiple sites     Past Surgical History:  Procedure Laterality Date   APPENDECTOMY  1986   COLONOSCOPY  2018   normal (dig [facility]a spec per pt report but that office has no records of her seeing them)   ESOPHAGOGASTRODUODENOSCOPY     neg for bleeding ?eval    Outpatient Medications Prior to Visit   Medication Sig Dispense Refill   ALPRAZolam (XANAX) 1 MG tablet Take 1 tablet (1 mg total) by mouth 3 (three) times daily. 90 tablet 5   amLODipine (NORVASC) 10 MG tablet Take 1 tablet (10 mg total) by mouth daily. 90 tablet 3   Ascorbic Acid (VITAMIN C PO) Take by mouth daily.     aspirin 81 MG chewable tablet Chew 81 mg by mouth daily in the afternoon.     atorvastatin (LIPITOR) 20 MG tablet TAKE 1 TABLET BY MOUTH EVERY DAY 90 tablet 1   Cholecalciferol (VITAMIN D3 PO) Take 50 mg by mouth daily.     Cyanocobalamin (VITAMIN B-12 PO) Take by mouth daily.     fluticasone (FLONASE) 50 MCG/ACT nasal spray Place 1 spray into both nostrils daily.     gabapentin (NEURONTIN) 100 MG capsule Take 100 mg by mouth in the morning, at noon, in the evening, and at bedtime.     HYDROcodone-acetaminophen (NORCO/VICODIN) 5-325 MG tablet 1 tab po bid prn pain 60 tablet 0   Iron (IRCON PO) Take 65 mg by mouth daily.     meloxicam (MOBIC) 15 MG tablet Take 1 tablet (15 mg total) by mouth daily. 15 tablet 0   metoprolol tartrate (LOPRESSOR) 25 MG tablet  TAKE 1 TABLET BY MOUTH TWICE A DAY 180 tablet 1   Omega-3 Fatty Acids (FISH OIL PO) Take by mouth daily.     pantoprazole (PROTONIX) 40 MG tablet Take 1 tablet (40 mg total) by mouth daily. Take 1 tablet by mouth daily. 90 tablet 3   traZODone (DESYREL) 50 MG tablet Take 1 tablet (50 mg total) by mouth daily. 90 tablet 3   VITAMIN E PO Take by mouth daily.     No facility-administered medications prior to visit.    Allergies  Allergen Reactions   Tramadol Itching    ROS As per HPI  PE: Vitals with BMI 12/07/2021 11/30/2021 11/09/2021  Height 5' 0.5" 5' 0.5" 5' 0.5"  Weight 148 lbs 13 oz 146 lbs 13 oz 148 lbs 2 oz  BMI 28.57 [id number] 99991111  Systolic 99991111 [id number] A999333  Diastolic 65 69 73  Pulse 50 78 78     Physical Exam  Gen: Alert, well appearing.  Patient is oriented to person, place, time, and situation. AFFECT: pleasant, lucid thought and speech. R  foot with focal TTP over 2nd MTP on plantar surface.  ROM intact. No swelling or erythema.  No tenderness anywhere else on foot.  LABS:  Last metabolic panel Lab Results  Component Value Date   GLUCOSE 85 10/19/2021   NA 139 10/19/2021   K 4.0 10/19/2021   CL 102 10/19/2021   CO2 32 10/19/2021   BUN 13 10/19/2021   CREATININE 0.99 10/19/2021   GFRNONAA 41 03/01/2021   CALCIUM 9.3 10/19/2021   ALBUMIN 4.0 03/01/2021   ALKPHOS 64 03/01/2021   AST 18 03/01/2021   ALT 19 03/01/2021   IMPRESSION AND PLAN:  Right second MTP capsulitis. No improvement with conservative management--metatarsal pad and NSAIDs. I recommended we try a steroid injection into the joint today and she was agreeable to this.  Procedure: Therapeutic R 2nd MTP steroid injection.  The patient's clinical condition is marked by substantial pain and/or significant functional disability.  Other conservative therapy has not provided relief, is contraindicated, or not appropriate.  There is a reasonable likelihood that injection will significantly improve the patient's pain and/or functional disability.  Consent obtained. Cleaned skin with alcohol swab, used ultrasound to visualize joint, needle visualized -->injected 1/2 ml of 40mg /ml kenalog +1  ml of 1% plain lidocaine into joint space without resistance.  No immediate complications.  Patient tolerated procedure well.  Post-injection care discussed, including 20 min of icing 1-2 times in the next 4-8 hours, frequent non weight-bearing ROM exercises over the next few days, and general pain medication management.  Post op shoe fitted, dispensed. Relative rest recommended. R foot x-ray ordered.  An After Visit Summary was printed and given to the patient.  FOLLOW UP: Return in about 2 weeks (around 12/21/2021) for f/u R foot pain. Chronic pain/RCI f/u due in 1 mo  Signed:  Crissie Sickles, MD           12/07/2021

## 2021-12-08 MED ORDER — HYDROCODONE-ACETAMINOPHEN 5-325 MG PO TABS: ORAL_TABLET | ORAL | 0 refills | Status: DC

## 2021-12-08 NOTE — Telephone Encounter (Signed)
Pt states [facility] not [facility]one in stock. Please resend rx to [facility].  Rx pending. Please review and advise

## 2021-12-09 NOTE — Telephone Encounter (Signed)
[facility] [place] [facility] not [facility] but [facility] in [place] may. Advised to follow up with pharmacy to make sure medication is in stock prior to [place] resending rx.

## 2021-12-12 ENCOUNTER — Other Ambulatory Visit: Payer: Self-pay | Admitting: Family Medicine

## 2021-12-12 MED ORDER — HYDROCODONE-ACETAMINOPHEN 5-325 MG PO TABS: ORAL_TABLET | ORAL | 0 refills | Status: DC

## 2021-12-12 NOTE — Telephone Encounter (Signed)
Please resend rx     Rx pending

## 2021-12-12 NOTE — Telephone Encounter (Signed)
[facility], pt called [facility] they have it.  Med refill for  HYDROcodone-acetaminophen HYDROcodone-acetaminophen (NORCO/VICODIN) 5-325 MG tablet   [id number] - [place], [place] - [address] AT [facility] [address] Phone:  [phone]  Fax:  [phone]

## 2021-12-13 NOTE — Telephone Encounter (Signed)
Pt states pharmacy did not have current rx sent on 2/6. Spoke with pharmacy and confirmed rx sent on 2/6 was not received.  Please resend rx.

## 2021-12-13 NOTE — Addendum Note (Signed)
Addended by: Deveron Furlong D on: 12/13/2021 03:05 PM   Modules accepted: Orders

## 2021-12-13 NOTE — Telephone Encounter (Signed)
See other phone note

## 2021-12-14 MED ORDER — HYDROCODONE-ACETAMINOPHEN 5-325 MG PO TABS: ORAL_TABLET | ORAL | 0 refills | Status: DC

## 2021-12-14 NOTE — Telephone Encounter (Signed)
Pharmacy tech and pharmacist confirmed that rx sent on 2/6 and 2/8 was not received. The only 2 options available are to resubmit again or the patient to receive a physical copy to take to the pharmacy for fill.  Please review and advise

## 2021-12-16 ENCOUNTER — Telehealth: Payer: Self-pay | Admitting: Family Medicine

## 2021-12-16 NOTE — Telephone Encounter (Signed)
Pt said she called about med, pharmacy said they didn't receive anything back from her PCP  Med refill  HYDROcodone-acetaminophen HYDROcodone-acetaminophen (NORCO/VICODIN) 5-325 MG tablet    [facility] #[id number] - [place], [place] - [address] AT [facility] OF [address] Phone:  [phone]  Fax:  [phone]

## 2021-12-16 NOTE — Telephone Encounter (Signed)
Patient states she checked with [facility] regarding her pain meds.  Patient states pharmacy has not received rx.  Please call patient with update. [phone]  HYDROcodone-acetaminophen (NORCO/VICODIN) 5-325 MG tablet [[id number]]

## 2021-12-16 NOTE — Telephone Encounter (Signed)
Please review and advise message from 2/8

## 2021-12-19 NOTE — Telephone Encounter (Signed)
According to pharmacy the last Rx received was from March of 2022. Please resend Rx.

## 2021-12-19 NOTE — Telephone Encounter (Signed)
Pt scheduled for 12/21/21

## 2021-12-20 NOTE — Telephone Encounter (Signed)
Spoke with an RPH at [facility], pt's pain med was filled under the wrong profile name. One of the techs or someone else at the pharmacy filled her pain med's under her husband's profile. He returned the medication but the rx was cancelled and they had no way of tracking any of the prescriptions we sent in previously for her. Pt has scheduled appt tomorrow at 10. Advised there was nothing we could do currently with provider out of the office for the afternoon. Pharmacist would inform pt.

## 2021-12-20 NOTE — Telephone Encounter (Signed)
Patient calling regarding pain meds.  She states pharmacy filled pain meds under spouse's profile.  I personally took a call from [facility] a few minutes earlier and I transferred call to [facility], Dr. Samul Dada assistant. I reviewed information patient gave to me is the same as what RpH at [facility] stated.  Please see previous message.  Patient has appt tomorrow with Dr. Milinda Cave.  Patient confirmed appt for 12/21/21.

## 2021-12-20 NOTE — Telephone Encounter (Signed)
FYI  Please see below

## 2021-12-21 ENCOUNTER — Encounter: Payer: Self-pay | Admitting: Family Medicine

## 2021-12-21 ENCOUNTER — Other Ambulatory Visit: Payer: Self-pay

## 2021-12-21 ENCOUNTER — Ambulatory Visit (INDEPENDENT_AMBULATORY_CARE_PROVIDER_SITE_OTHER): Payer: Medicare Other | Admitting: Family Medicine

## 2021-12-21 VITALS — BP 137/71 | HR 56 | Temp 97.6°F | Ht 60.5 in | Wt 147.0 lb

## 2021-12-21 DIAGNOSIS — M7751 Other enthesopathy of right foot: Secondary | ICD-10-CM

## 2021-12-21 DIAGNOSIS — G894 Chronic pain syndrome: Secondary | ICD-10-CM

## 2021-12-21 DIAGNOSIS — I10 Essential (primary) hypertension: Secondary | ICD-10-CM

## 2021-12-21 MED ORDER — FLUTICASONE PROPIONATE 50 MCG/ACT NA SUSP: 1.0000 | Freq: Every day | NASAL | 3 refills | Status: DC

## 2021-12-21 NOTE — Telephone Encounter (Signed)
Spoke with pt regarding med refill.

## 2021-12-21 NOTE — Telephone Encounter (Signed)
Spoke with Ines Bloomer, pharmacist regarding recent med error. The medication was scanned under the husband's chart then deleted from the system. Husband brought the medication back the same day once realizing it was the wrong medication. They still have a print out from 2/6 for Hydrocodone medication that can be used to fill prescription or send new rx to be filled. Provider was verbally made aware and he would prefer to use rx from 2/6 that is still available. Will contact pharmacy to have filled for patient. Patient was made aware of updates during appt.

## 2021-12-21 NOTE — Progress Notes (Signed)
OFFICE VISIT  12/21/2021  CC:  Chief Complaint  Patient presents with   Follow-up    Foot pain (R)    Patient is a 76 y.o. female who presents for 2 wk f/u R foot/toe pain. A/P as of last visit: "Right second MTP capsulitis. No improvement with conservative management--metatarsal pad and NSAIDs. I recommended we try a steroid injection into the joint today and she was agreeable to this. Procedure: Therapeutic R 2nd MTP steroid injection".  INTERIM HX: Her foot x-ray showed no acute abnormality.  She did have some chronic degenerative changes at the first MTP with hallux valgus deformity. After last visit injections he said the pain got significantly better and her foot feels well now.  She did wear the postop shoe some as well.  Her low back pain has been worse lately because she has been out of her hydrocodone pills. We had to resort to a different pharmacy because [facility]. Apparently this prescription was dispensed to her husband inadvertently.  Pharmacy now has rectified the mistake and she can pick this medication up today.  Checking blood pressure still occasionally at home and always less than 130/80.   PMP AWARE reviewed today: most recent rx for vicodin was filled 11/01/21, # 60, rx by me. Most recent alpraz rx filled 11/28/21, #90, rx by me. No red flags.   Past Medical History:  Diagnosis Date   Anxiety and depression    Bilateral edema of lower extremity    Chronic pain syndrome    Dr. Laurian Brim   Chronic renal insufficiency, stage 3 (moderate) (HCC)    GFR 40sd   Frequent headaches    GERD (gastroesophageal reflux disease)    History of sepsis    UTI/admission   Hypercholesterolemia    Hypertension    Insomnia    Iron deficiency anemia    ?etiology.   Osteoarthritis, multiple sites     Past Surgical History:  Procedure Laterality Date   APPENDECTOMY  1986   COLONOSCOPY  2018   normal (dig hea spec per pt report but that  office has no records of her seeing them)   ESOPHAGOGASTRODUODENOSCOPY     neg for bleeding ?eval    Outpatient Medications Prior to Visit  Medication Sig Dispense Refill   ALPRAZolam (XANAX) 1 MG tablet Take 1 tablet (1 mg total) by mouth 3 (three) times daily. 90 tablet 5   amLODipine (NORVASC) 10 MG tablet Take 1 tablet (10 mg total) by mouth daily. 90 tablet 3   Ascorbic Acid (VITAMIN C PO) Take by mouth daily.     aspirin 81 MG chewable tablet Chew 81 mg by mouth daily in the afternoon.     atorvastatin (LIPITOR) 20 MG tablet TAKE 1 TABLET BY MOUTH EVERY DAY 90 tablet 1   Cholecalciferol (VITAMIN D3 PO) Take 50 mg by mouth daily.     Cyanocobalamin (VITAMIN B-12 PO) Take by mouth daily.     gabapentin (NEURONTIN) 100 MG capsule Take 100 mg by mouth in the morning, at noon, in the evening, and at bedtime.     Iron (IRCON PO) Take 65 mg by mouth daily.     meloxicam (MOBIC) 15 MG tablet Take 1 tablet (15 mg total) by mouth daily. 15 tablet 0   metoprolol tartrate (LOPRESSOR) 25 MG tablet TAKE 1 TABLET BY MOUTH TWICE A DAY 180 tablet 1   Omega-3 Fatty Acids (FISH OIL PO) Take by mouth daily.  pantoprazole (PROTONIX) 40 MG tablet Take 1 tablet (40 mg total) by mouth daily. Take 1 tablet by mouth daily. 90 tablet 3   traZODone (DESYREL) 50 MG tablet Take 1 tablet (50 mg total) by mouth daily. 90 tablet 3   VITAMIN E PO Take by mouth daily.     fluticasone (FLONASE) 50 MCG/ACT nasal spray Place 1 spray into both nostrils daily.     HYDROcodone-acetaminophen (NORCO/VICODIN) 5-325 MG tablet 1 tab po bid prn pain (Patient not taking: Reported on 12/21/2021) 60 tablet 0   No facility-administered medications prior to visit.    Allergies  Allergen Reactions   Tramadol Itching    ROS As per HPI  PE: Vitals with BMI 12/21/2021 12/07/2021 11/30/2021  Height 5' 0.5" 5' 0.5" 5' 0.5"  Weight 147 lbs 148 lbs 13 oz 146 lbs 13 oz  BMI 28.23 [id number]  Systolic 0000000 [id number]  Diastolic 71 65  69  Pulse 56 50 78     Physical Exam  Gen: Alert, well appearing.  Patient is oriented to person, place, time, and situation. AFFECT: pleasant, lucid thought and speech. R foot w/out tenderness anywhere.  No erythema or swelling or skin breakdown.  LABS:  Last CBC Lab Results  Component Value Date   WBC 6.2 10/19/2021   HGB 11.8 (L) 10/19/2021   HCT 36.1 10/19/2021   MCV 88.1 10/19/2021   RDW 13.7 10/19/2021   PLT 248.0 [id number]   Last metabolic panel Lab Results  Component Value Date   GLUCOSE 85 10/19/2021   NA 139 10/19/2021   K 4.0 10/19/2021   CL 102 10/19/2021   CO2 32 10/19/2021   BUN 13 10/19/2021   CREATININE 0.99 10/19/2021   GFRNONAA 41 03/01/2021   CALCIUM 9.3 10/19/2021   ALBUMIN 4.0 03/01/2021   ALKPHOS 64 03/01/2021   AST 18 03/01/2021   ALT 19 03/01/2021   IMPRESSION AND PLAN:  1) Right foot second MTP capsulitis--resolved with intra-articular steroid injection and use of postop shoe.  She is now back to wearing her regular shoes and her employer ([employer]) is making some changes to allow her to drive less and lift less.  2) chronic low back pain.  Problems getting her prescribed medication.  Problem is now rectified and she will pick up her prescription today--- Vicodin 5/325, 1 twice daily, #60.  3) hypertension, well controlled on Lopressor 25 mg twice daily and amlodipine 10 mg daily.  An After Visit Summary was printed and given to the patient.  FOLLOW UP: Return in about 3 months (around 03/20/2022) for f/u pain/med--fasting.  Signed:  Crissie Sickles, MD           12/21/2021

## 2021-12-21 NOTE — Telephone Encounter (Signed)
Spoke with Ines Bloomer, pharmacist to advise provider would like to use 2/6 prescription. LM for pt to return call.

## 2021-12-21 NOTE — Telephone Encounter (Signed)
I am even more confused than before!

## 2021-12-21 NOTE — Telephone Encounter (Signed)
Patient returned Britt's call during lunch hour.  Moshe Cipro, please call patient at your convenience.   [phone]

## 2022-01-18 ENCOUNTER — Other Ambulatory Visit: Payer: Self-pay | Admitting: Family Medicine

## 2022-01-18 MED ORDER — HYDROCODONE-ACETAMINOPHEN 5-325 MG PO TABS: ORAL_TABLET | ORAL | 0 refills | Status: DC

## 2022-01-18 NOTE — Telephone Encounter (Signed)
Requesting: Norco ?Contract: 10/19/21 ?UDS: 09/28/21 ?Last Visit: 12/21/21 ?Next Visit: 03/22/22 ?Last Refill: 12/14/21(60,0) ? ?Please Advise. Med pending ?

## 2022-01-18 NOTE — Telephone Encounter (Signed)
Med refill ? ? ?HYDROcodone-acetaminophen ?HYDROcodone-acetaminophen (NORCO/VICODIN) 5-325 MG tablet ? ? ?[id number] - [place],  - [address] Phone:  [phone]  ?Fax:  [phone]  ?  ? ? ?

## 2022-01-27 ENCOUNTER — Telehealth: Payer: Self-pay

## 2022-01-27 NOTE — Telephone Encounter (Signed)
Called pt to schedule AWV. Please schedule with health coach, Toria or Tamela Elsayed. ? ?

## 2022-01-27 NOTE — Telephone Encounter (Signed)
Patient called back.  I scheduled appt with HealthCoach on 3/29 at 10AM. Patient request to be called. ?

## 2022-02-01 ENCOUNTER — Other Ambulatory Visit: Payer: Self-pay

## 2022-02-01 ENCOUNTER — Ambulatory Visit (INDEPENDENT_AMBULATORY_CARE_PROVIDER_SITE_OTHER): Payer: Medicare Other

## 2022-02-01 DIAGNOSIS — Z0000 Encounter for general adult medical examination without abnormal findings: Secondary | ICD-10-CM

## 2022-02-01 NOTE — Patient Instructions (Signed)
Sheila Newman , ?Thank you for taking time to come for your Medicare Wellness Visit. I appreciate your ongoing commitment to your health goals. Please review the following plan we discussed and let me know if I can assist you in the future.  ? ?Screening recommendations/referrals: ?Colonoscopy: Done 11/06/16 repeat every 10 years  ?Mammogram: Done 01/15/15 repeat every year  ?Recommended yearly ophthalmology/optometry visit for glaucoma screening and checkup ?Recommended yearly dental visit for hygiene and checkup ? ?Vaccinations: ?Influenza vaccine: Done 09/04/21 repeat every year  ?Pneumococcal vaccine: Up to date ?Tdap vaccine: Done 02/11/15 repeat eery 10 years  ?Shingles vaccine: Shingrix discussed. Please contact your pharmacy for coverage information.    ?Covid-19:Completed 3/11, 4/1, 10/08/20, 09/04/21 ? ?Advanced directives: Please bring a copy of your health care power of attorney and living will to the office at your convenience. ? ?Conditions/risks identified: None at this time  ? ?Next appointment: Follow up in one year for your annual wellness visit  ? ? ?Preventive Care 51 Years and Older, Female ?Preventive care refers to lifestyle choices and visits with your health care provider that can promote health and wellness. ?What does preventive care include? ?A yearly physical exam. This is also called an annual well check. ?Dental exams once or twice a year. ?Routine eye exams. Ask your health care provider how often you should have your eyes checked. ?Personal lifestyle choices, including: ?Daily care of your teeth and gums. ?Regular physical activity. ?Eating a healthy diet. ?Avoiding tobacco and drug use. ?Limiting alcohol use. ?Practicing safe sex. ?Taking low-dose aspirin every day. ?Taking vitamin and mineral supplements as recommended by your health care provider. ?What happens during an annual well check? ?The services and screenings done by your health care provider during your annual well check will  depend on your age, overall health, lifestyle risk factors, and family history of disease. ?Counseling  ?Your health care provider may ask you questions about your: ?Alcohol use. ?Tobacco use. ?Drug use. ?Emotional well-being. ?Home and relationship well-being. ?Sexual activity. ?Eating habits. ?History of falls. ?Memory and ability to understand (cognition). ?Work and work Astronomer. ?Reproductive health. ?Screening  ?You may have the following tests or measurements: ?Height, weight, and BMI. ?Blood pressure. ?Lipid and cholesterol levels. These may be checked every 5 years, or more frequently if you are over 59 years old. ?Skin check. ?Lung cancer screening. You may have this screening every year starting at age 46 if you have a 30-pack-year history of smoking and currently smoke or have quit within the past 15 years. ?Fecal occult blood test (FOBT) of the stool. You may have this test every year starting at age 43. ?Flexible sigmoidoscopy or colonoscopy. You may have a sigmoidoscopy every 5 years or a colonoscopy every 10 years starting at age 64. ?Hepatitis C blood test. ?Hepatitis B blood test. ?Sexually transmitted disease (STD) testing. ?Diabetes screening. This is done by checking your blood sugar (glucose) after you have not eaten for a while (fasting). You may have this done every 1-3 years. ?Bone density scan. This is done to screen for osteoporosis. You may have this done starting at age 1. ?Mammogram. This may be done every 1-2 years. Talk to your health care provider about how often you should have regular mammograms. ?Talk with your health care provider about your test results, treatment options, and if necessary, the need for more tests. ?Vaccines  ?Your health care provider may recommend certain vaccines, such as: ?Influenza vaccine. This is recommended every year. ?Tetanus, diphtheria, and acellular  pertussis (Tdap, Td) vaccine. You may need a Td booster every 10 years. ?Zoster vaccine. You may  need this after age 33. ?Pneumococcal 13-valent conjugate (PCV13) vaccine. One dose is recommended after age 51. ?Pneumococcal polysaccharide (PPSV23) vaccine. One dose is recommended after age 50. ?Talk to your health care provider about which screenings and vaccines you need and how often you need them. ?This information is not intended to replace advice given to you by your health care provider. Make sure you discuss any questions you have with your health care provider. ?Document Released: 11/19/2015 Document Revised: 07/12/2016 Document Reviewed: 08/24/2015 ?Elsevier Interactive Patient Education ? 2017 [place]. ? ?Fall Prevention in the Home ?Falls can cause injuries. They can happen to people of all ages. There are many things you can do to make your home safe and to help prevent falls. ?What can I do on the outside of my home? ?Regularly fix the edges of walkways and driveways and fix any cracks. ?Remove anything that might make you trip as you walk through a door, such as a raised step or threshold. ?Trim any bushes or trees on the path to your home. ?Use bright outdoor lighting. ?Clear any walking paths of anything that might make someone trip, such as rocks or tools. ?Regularly check to see if handrails are loose or broken. Make sure that both sides of any steps have handrails. ?Any raised decks and porches should have guardrails on the edges. ?Have any leaves, snow, or ice cleared regularly. ?Use sand or salt on walking paths during winter. ?Clean up any spills in your garage right away. This includes oil or grease spills. ?What can I do in the bathroom? ?Use night lights. ?Install grab bars by the toilet and in the tub and shower. Do not use towel bars as grab bars. ?Use non-skid mats or decals in the tub or shower. ?If you need to sit down in the shower, use a plastic, non-slip stool. ?Keep the floor dry. Clean up any water that spills on the floor as soon as it happens. ?Remove soap buildup in  the tub or shower regularly. ?Attach bath mats securely with double-sided non-slip rug tape. ?Do not have throw rugs and other things on the floor that can make you trip. ?What can I do in the bedroom? ?Use night lights. ?Make sure that you have a light by your bed that is easy to reach. ?Do not use any sheets or blankets that are too big for your bed. They should not hang down onto the floor. ?Have a firm chair that has side arms. You can use this for support while you get dressed. ?Do not have throw rugs and other things on the floor that can make you trip. ?What can I do in the kitchen? ?Clean up any spills right away. ?Avoid walking on wet floors. ?Keep items that you use a lot in easy-to-reach places. ?If you need to reach something above you, use a strong step stool that has a grab bar. ?Keep electrical cords out of the way. ?Do not use floor polish or wax that makes floors slippery. If you must use wax, use non-skid floor wax. ?Do not have throw rugs and other things on the floor that can make you trip. ?What can I do with my stairs? ?Do not leave any items on the stairs. ?Make sure that there are handrails on both sides of the stairs and use them. Fix handrails that are broken or loose. Make sure that handrails  are as long as the stairways. ?Check any carpeting to make sure that it is firmly attached to the stairs. Fix any carpet that is loose or worn. ?Avoid having throw rugs at the top or bottom of the stairs. If you do have throw rugs, attach them to the floor with carpet tape. ?Make sure that you have a light switch at the top of the stairs and the bottom of the stairs. If you do not have them, ask someone to add them for you. ?What else can I do to help prevent falls? ?Wear shoes that: ?Do not have high heels. ?Have rubber bottoms. ?Are comfortable and fit you well. ?Are closed at the toe. Do not wear sandals. ?If you use a stepladder: ?Make sure that it is fully opened. Do not climb a closed  stepladder. ?Make sure that both sides of the stepladder are locked into place. ?Ask someone to hold it for you, if possible. ?Clearly mark and make sure that you can see: ?Any grab bars or handrails. ?First and l

## 2022-02-01 NOTE — Progress Notes (Signed)
Virtual Visit via Telephone Note ? ?I connected with  Sheila Newman on 02/01/22 at 10:00 AM EDT by telephone and verified that I am speaking with the correct person using two identifiers. ? ?Medicare Annual Wellness visit completed telephonically due to Covid-19 pandemic.  ? ?Persons participating in this call: This Health Coach and this patient.  ? ?Location: ?Patient: Home ?Provider: Office ?  ?I discussed the limitations, risks, security and privacy concerns of performing an evaluation and management service by telephone and the availability of in person appointments. The patient expressed understanding and agreed to proceed. ? ?Unable to perform video visit due to video visit attempted and failed and/or patient does not have video capability.  ? ?Some vital signs may be absent or patient reported.  ? ?Willette Brace, LPN ? ? ?Subjective:  ? Sheila Newman is a 76 y.o. female who presents for an Initial Medicare Annual Wellness Visit. ? ?Review of Systems    ? ?Cardiac Risk Factors include: advanced age (>38men, >91 women);dyslipidemia;hypertension ? ?   ?Objective:  ?  ?There were no vitals filed for this visit. ?There is no height or weight on file to calculate BMI. ? ? ?  02/01/2022  ? 10:08 AM  ?Advanced Directives  ?Does Patient Have a Medical Advance Directive? Yes  ?Type of Advance Directive Healthcare Power of Attorney  ?Copy of [place] in Chart? No - copy requested  ? ? ?Current Medications (verified) ?Outpatient Encounter Medications as of 02/01/2022  ?Medication Sig  ? ALPRAZolam (XANAX) 1 MG tablet Take 1 tablet (1 mg total) by mouth 3 (three) times daily.  ? amLODipine (NORVASC) 10 MG tablet Take 1 tablet (10 mg total) by mouth daily.  ? Ascorbic Acid (VITAMIN C PO) Take by mouth daily.  ? aspirin 81 MG chewable tablet Chew 81 mg by mouth daily in the afternoon.  ? atorvastatin (LIPITOR) 20 MG tablet TAKE 1 TABLET BY MOUTH EVERY DAY  ? Cholecalciferol (VITAMIN D3 PO) Take 50  mg by mouth daily.  ? Cyanocobalamin (VITAMIN B-12 PO) Take by mouth daily.  ? fluticasone (FLONASE) 50 MCG/ACT nasal spray Place 1 spray into both nostrils daily.  ? gabapentin (NEURONTIN) 100 MG capsule Take 100 mg by mouth in the morning, at noon, in the evening, and at bedtime.  ? HYDROcodone-acetaminophen (NORCO/VICODIN) 5-325 MG tablet 1 tab po bid prn pain  ? Iron (IRCON PO) Take 65 mg by mouth daily.  ? meloxicam (MOBIC) 15 MG tablet Take 1 tablet (15 mg total) by mouth daily.  ? metoprolol tartrate (LOPRESSOR) 25 MG tablet TAKE 1 TABLET BY MOUTH TWICE A DAY  ? Omega-3 Fatty Acids (FISH OIL PO) Take by mouth daily.  ? pantoprazole (PROTONIX) 40 MG tablet Take 1 tablet (40 mg total) by mouth daily. Take 1 tablet by mouth daily.  ? traZODone (DESYREL) 50 MG tablet Take 1 tablet (50 mg total) by mouth daily.  ? VITAMIN E PO Take by mouth daily.  ? ?No facility-administered encounter medications on file as of 02/01/2022.  ? ? ?Allergies (verified) ?Tramadol  ? ?History: ?Past Medical History:  ?Diagnosis Date  ? Anxiety and depression   ? Bilateral edema of lower extremity   ? Chronic pain syndrome   ? Dr. Francesco Runner  ? Chronic renal insufficiency, stage 3 (moderate) (HCC)   ? GFR 40sd  ? Frequent headaches   ? GERD (gastroesophageal reflux disease)   ? History of sepsis   ? UTI/admission  ?  Hypercholesterolemia   ? Hypertension   ? Insomnia   ? Iron deficiency anemia   ? ?etiology.  ? Osteoarthritis, multiple sites   ? ?Past Surgical History:  ?Procedure Laterality Date  ? APPENDECTOMY  1986  ? COLONOSCOPY  2018  ? normal (dig hea spec per pt report but that office has no records of her seeing them)  ? ESOPHAGOGASTRODUODENOSCOPY    ? neg for bleeding ?eval  ? ?Family History  ?Problem Relation Age of Onset  ? Arthritis Mother   ? Diabetes Mother   ? Heart attack Father   ? Diabetes Father   ? Arthritis Sister   ? COPD Sister   ? Diabetes Brother   ? Diabetes Brother   ? Diabetes Brother   ? Diabetes Brother   ?  Diabetes Brother   ? Diabetes Brother   ? ?Social History  ? ?Socioeconomic History  ? Marital status: Married  ?  Spouse name: Not on file  ? Number of children: Not on file  ? Years of education: Not on file  ? Highest education level: Not on file  ?Occupational History  ? Not on file  ?Tobacco Use  ? Smoking status: Never  ? Smokeless tobacco: Never  ?Substance and Sexual Activity  ? Alcohol use: Never  ? Drug use: Never  ? Sexual activity: Not on file  ?Other Topics Concern  ? Not on file  ?Social History Narrative  ? Widowed-->reMarried, 2 biologic, 2 adopted.  ? Educ: HS.  From [place]  ? Occup: [employer] x 40 yrs.  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  ? No T/A/Ds.  ? ?Social Determinants of Health  ? ?Financial Resource Strain: Low Risk   ? Difficulty of Paying Living Expenses: Not hard at all  ?Food Insecurity: No Food Insecurity  ? Worried About Charity fundraiser in the Last Year: Never true  ? Ran Out of Food in the Last Year: Never true  ?Transportation Needs: No Transportation Needs  ? Lack of Transportation (Medical): No  ? Lack of Transportation (Non-Medical): No  ?Physical Activity: Inactive  ? Days of Exercise per Week: 0 days  ? Minutes of Exercise per Session: 0 min  ?Stress: Stress Concern Present  ? Feeling of Stress : To some extent  ?Social Connections: Moderately Isolated  ? Frequency of Communication with Friends and Family: Once a week  ? Frequency of Social Gatherings with Friends and Family: More than three times a week  ? Attends Religious Services: More than 4 times per year  ? Active Member of Clubs or Organizations: No  ? Attends Archivist Meetings: Never  ? Marital Status: Separated  ? ? ?Tobacco Counseling ?Counseling given: Not Answered ? ? ?Clinical Intake: ? ?Pre-visit preparation completed: Yes ? ?Pain : No/denies pain ? ?  ? ?BMI - recorded: 28.24 ?Nutritional Risks: None ?Diabetes: No ? ?How often do you need to have someone help you when you read  instructions, pamphlets, or other written materials from your doctor or pharmacy?: 1 - Never ? ?Diabetic?no ? ?Interpreter Needed?: No ? ?Information entered by :: Charlott Rakes, LPN ? ? ?Activities of Daily Living ? ?  02/01/2022  ? 10:09 AM  ?In your present state of health, do you have any difficulty performing the following activities:  ?Hearing? 0  ?Vision? 0  ?Difficulty concentrating or making decisions? 0  ?Walking or climbing stairs? 0  ?Dressing or bathing? 0  ?Doing errands, shopping? 0  ?Preparing Food  and eating ? N  ?Using the Toilet? N  ?In the past six months, have you accidently leaked urine? Y  ?Do you have problems with loss of bowel control? Y  ?Comment at times  ?Managing your Medications? N  ?Managing your Finances? N  ?Housekeeping or managing your Housekeeping? N  ? ? ?Patient Care Team: ?McGowen, Adrian Blackwater, MD as PCP - General (Family Medicine) ?Harlen Labs, MD as Referring Physician (Optometry) ?Mabe, Tim, DMD (Dentistry) ?Dorene Ar, MD ? ?Indicate any recent Medical Services you may have received from other than [facility] providers in the past year (date may be approximate). ? ?   ?Assessment:  ? This is a routine wellness examination for Sheila Newman. ? ?Hearing/Vision screen ?Hearing Screening - Comments:: Pt denies any hearing issues ?Vision Screening - Comments:: Pt follows up with Dr Evelena Leyden at [facility] for annual [facility] exams  ? ?Dietary issues and exercise activities discussed: ?Current Exercise Habits: The patient has a physically strenuous job, but has no regular exercise apart from work. ? ? Goals Addressed   ? ?  ?  ?  ?  ? This Visit's Progress  ?  Patient Stated     ?  None at this time ?  ? ?  ? ?Depression Screen ? ?  02/01/2022  ? 10:06 AM 09/28/2021  ?  2:15 PM  ?PHQ 2/9 Scores  ?PHQ - 2 Score 0 0  ?  ?Fall Risk ? ?  02/01/2022  ? 10:09 AM 09/28/2021  ?  2:14 PM  ?Fall Risk   ?Falls in the past year? 1 1  ?Number falls in past yr: 1 0  ?Injury with Fall? 1 0  ?Comment injury to back    ?Risk for fall due to : Impaired vision;Impaired balance/gait   ?Follow up Falls prevention discussed Falls evaluation completed  ? ? ?FALL RISK PREVENTION PERTAINING TO THE HOME: ? ?Any stairs in or around the home? N

## 2022-02-17 ENCOUNTER — Other Ambulatory Visit: Payer: Self-pay

## 2022-02-17 MED ORDER — HYDROCODONE-ACETAMINOPHEN 5-325 MG PO TABS: ORAL_TABLET | ORAL | 0 refills | Status: DC

## 2022-02-17 NOTE — Telephone Encounter (Signed)
Requesting: Norco ?Contract: 10/19/21 ?UDS:09/28/21 ?Last Visit: 12/21/21 ?Next Visit: 03/22/22 ?Last Refill: 01/18/22(60,0) ? ?Please Advise. Med pending ?

## 2022-02-17 NOTE — Addendum Note (Signed)
Addended by: Emi Holes D on: 02/17/2022 11:29 AM ? ? Modules accepted: Orders ? ?

## 2022-02-17 NOTE — Telephone Encounter (Signed)
Patient refill request.  [facility] ? ?HYDROcodone-acetaminophen (NORCO/VICODIN) 5-325 MG tablet [id number]  ?

## 2022-03-02 ENCOUNTER — Other Ambulatory Visit: Payer: Self-pay

## 2022-03-02 MED ORDER — GABAPENTIN 100 MG PO CAPS: 100.0000 mg | ORAL_CAPSULE | Freq: Four times a day (QID) | ORAL | 1 refills | Status: DC

## 2022-03-02 NOTE — Telephone Encounter (Signed)
RF request for gabapentin ?LOV: 12/21/21 ?Next ov: 03/22/22 ?Last written: 08/04/21 ? ?Previously written by historical provider. Please fill, if appropriate. ? ?

## 2022-03-02 NOTE — Telephone Encounter (Signed)
Patient refill request.  [facility] ? ?Please note the only prescription she gets [facility] is Norco ?All other [facility]s [facility] filled at [facility] ? ?gabapentin (NEURONTIN) 100 MG capsule [[id number]]  ?

## 2022-03-14 ENCOUNTER — Other Ambulatory Visit: Payer: Self-pay | Admitting: Family Medicine

## 2022-03-20 ENCOUNTER — Other Ambulatory Visit: Payer: Self-pay

## 2022-03-20 MED ORDER — HYDROCODONE-ACETAMINOPHEN 5-325 MG PO TABS: ORAL_TABLET | ORAL | 0 refills | Status: DC

## 2022-03-20 NOTE — Telephone Encounter (Signed)
Requesting: norco ?Contract: 12/14/220 ?UDS:09/28/21 ?Last Visit:12/21/21 ?Next Visit:03/22/22 ?Last Refill: 02/17/22 (60,0) ? ?Please Advise ? ?

## 2022-03-20 NOTE — Telephone Encounter (Signed)
[facility] request. [facility] ? ?HYDROcodone-acetaminophen (NORCO/VICODIN) 5-325 MG tablet [[id number]]  ? ? ?

## 2022-03-22 ENCOUNTER — Encounter: Payer: Self-pay | Admitting: Family Medicine

## 2022-03-22 ENCOUNTER — Ambulatory Visit (INDEPENDENT_AMBULATORY_CARE_PROVIDER_SITE_OTHER): Payer: Medicare Other | Admitting: Family Medicine

## 2022-03-22 VITALS — BP 93/56 | HR 54 | Temp 97.9°F | Ht 60.5 in | Wt 148.8 lb

## 2022-03-22 DIAGNOSIS — Z79899 Other long term (current) drug therapy: Secondary | ICD-10-CM

## 2022-03-22 DIAGNOSIS — M48062 Spinal stenosis, lumbar region with neurogenic claudication: Secondary | ICD-10-CM

## 2022-03-22 DIAGNOSIS — I10 Essential (primary) hypertension: Secondary | ICD-10-CM

## 2022-03-22 DIAGNOSIS — E7800 Pure hypercholesterolemia, unspecified: Secondary | ICD-10-CM

## 2022-03-22 DIAGNOSIS — M25512 Pain in left shoulder: Secondary | ICD-10-CM | POA: Diagnosis not present

## 2022-03-22 DIAGNOSIS — M25511 Pain in right shoulder: Secondary | ICD-10-CM

## 2022-03-22 DIAGNOSIS — G894 Chronic pain syndrome: Secondary | ICD-10-CM | POA: Diagnosis not present

## 2022-03-22 DIAGNOSIS — M545 Low back pain, unspecified: Secondary | ICD-10-CM | POA: Diagnosis not present

## 2022-03-22 DIAGNOSIS — G8929 Other chronic pain: Secondary | ICD-10-CM

## 2022-03-22 MED ORDER — ALPRAZOLAM 1 MG PO TABS: 1.0000 mg | ORAL_TABLET | Freq: Three times a day (TID) | ORAL | 5 refills | Status: DC

## 2022-03-22 MED ORDER — MELOXICAM 15 MG PO TABS: 15.0000 mg | ORAL_TABLET | Freq: Every day | ORAL | 1 refills | Status: DC

## 2022-03-22 MED ORDER — TRIAMCINOLONE ACETONIDE 40 MG/ML IJ SUSP
40.0000 mg | Freq: Once | INTRAMUSCULAR | Status: AC
  Administered 2022-03-22: 40 mg via INTRAMUSCULAR

## 2022-03-22 NOTE — Addendum Note (Signed)
Addended by: Deveron Furlong D on: 03/22/2022 05:00 PM ? ? Modules accepted: Orders ? ?

## 2022-03-22 NOTE — Progress Notes (Signed)
OFFICE VISIT ? ?03/22/2022 ? ?CC:  ?Chief Complaint  ?Patient presents with  ? Hypertension  ? ? ?Patient is a 76 y.o. female who presents for 3 mo f/u chronic pain syndrome and HTN. ?A/P as of last visit: ?"1) Right foot second MTP capsulitis--resolved with intra-articular steroid injection and use of postop shoe.  She is now back to wearing her regular shoes and her employer ([employer]) is making some changes to allow her to drive less and lift less. ?  ?2) chronic low back pain.  Problems getting her prescribed medication.  Problem is now rectified and she will pick up her prescription today--- Vicodin 5/325, 1 twice daily, #60. ?  ?3) hypertension, well controlled on Lopressor 25 mg twice daily and amlodipine 10 mg daily. ? ?INTERIM HX: ?Chronic bilateral shoulder pain, waxes and wanes over the years. ?Worse in right shoulder over the last several months.  Hurts all the time, achy when trying to sleep, worse when trying to raise her arm up or reach out.  No neck pain, no arm or hand weakness.  Similar symptoms in left shoulder but less severe. ?Says she has had shoulder x-rays in the past but is not sure what they showed.  She has never done PT. ?She says she has periodically got steroid injections in each shoulder and these have provided significant benefit.  To her best recollection her last steroid injection in the right shoulder was about a year ago ? ?Increased atorvastatin to 20mg  qd last visit. ? ? ?Indication for chronic opioid: chronic neck, mid back and low back pain, DDD, neurogenic claudication/spinal stenosis (R>L). ?Medication and dose: vicodin 5/325, 1 bid ?# pills per month: 60 ?PMP AWARE reviewed today: most recent rx for vicodin was filled 02/17/22, # 24, rx by me.  Most recent rx for alprazolam filled 02/24/22, #90, rx by me. ?No red flags. ? ?Past Medical History:  ?Diagnosis Date  ? Anxiety and depression   ? Bilateral edema of lower extremity   ? Chronic pain syndrome   ? Dr. Francesco Runner  ?  Chronic renal insufficiency, stage 3 (moderate) (HCC)   ? GFR 40sd  ? Frequent headaches   ? GERD (gastroesophageal reflux disease)   ? History of sepsis   ? UTI/admission  ? Hypercholesterolemia   ? Hypertension   ? Insomnia   ? Iron deficiency anemia   ? ?etiology.  ? Osteoarthritis, multiple sites   ? ? ?Past Surgical History:  ?Procedure Laterality Date  ? APPENDECTOMY  1986  ? COLONOSCOPY  2018  ? normal ([facility] per pt report but that office has no records of her seeing them)  ? ESOPHAGOGASTRODUODENOSCOPY    ? neg for bleeding ?eval  ? ? ?Outpatient Medications Prior to Visit  ?Medication Sig Dispense Refill  ? amLODipine (NORVASC) 10 MG tablet Take 1 tablet (10 mg total) by mouth daily. 90 tablet 3  ? Ascorbic Acid (VITAMIN C PO) Take by mouth daily.    ? aspirin 81 MG chewable tablet Chew 81 mg by mouth daily in the afternoon.    ? atorvastatin (LIPITOR) 20 MG tablet TAKE 1 TABLET BY MOUTH EVERY DAY 90 tablet 1  ? Cholecalciferol (VITAMIN D3 PO) Take 50 mg by mouth daily.    ? Cyanocobalamin (VITAMIN B-12 PO) Take by mouth daily.    ? fluticasone (FLONASE) 50 MCG/ACT nasal spray Place 1 spray into both nostrils daily. 48 g 3  ? gabapentin (NEURONTIN) 100 MG capsule Take 1 capsule (  100 mg total) by mouth in the morning, at noon, in the evening, and at bedtime. 360 capsule 1  ? HYDROcodone-acetaminophen (NORCO/VICODIN) 5-325 MG tablet 1 tab po bid prn pain 60 tablet 0  ? Iron (IRCON PO) Take 65 mg by mouth daily.    ? metoprolol tartrate (LOPRESSOR) 25 MG tablet TAKE 1 TABLET BY MOUTH TWICE A DAY 180 tablet 1  ? Omega-3 Fatty Acids (FISH OIL PO) Take by mouth daily.    ? pantoprazole (PROTONIX) 40 MG tablet Take 1 tablet (40 mg total) by mouth daily. Take 1 tablet by mouth daily. 90 tablet 3  ? traZODone (DESYREL) 50 MG tablet Take 1 tablet (50 mg total) by mouth daily. 90 tablet 3  ? VITAMIN E PO Take by mouth daily.    ? ALPRAZolam (XANAX) 1 MG tablet Take 1 tablet (1 mg total) by mouth 3 (three) times  daily. 90 tablet 5  ? meloxicam (MOBIC) 15 MG tablet Take 1 tablet (15 mg total) by mouth daily. 15 tablet 0  ? ?No facility-administered medications prior to visit.  ? ? ?Allergies  ?Allergen Reactions  ? Tramadol Itching  ? ? ?ROS ?As per HPI ? ?PE: ? ?  03/22/2022  ? 11:09 AM 12/21/2021  ? 10:27 AM 12/07/2021  ?  9:50 AM  ?Vitals with BMI  ?Height 5' 0.5" 5' 0.5" 5' 0.5"  ?Weight 148 lbs 13 oz 147 lbs 148 lbs 13 oz  ?BMI 28.57 28.23 28.57  ?Systolic 93 0000000 [id number]  ?Diastolic 56 71 65  ?Pulse 54 56 50  ? ?Physical Exam ? ?General: Alert and well-appearing. ?AFFECT: pleasant, lucid thought and speech. ?Right shoulder: Tenderness to palpation about the entire shoulder girdle. ?Range of motion painful in all motions, positive Hawkins, positive Neer's, positive O'Brien's. ?Negative drop arm test.  +scarf sign. ?Tender over biceps tendon in bicipital groove. ?Similar findings in left shoulder although intensity is less. ? ?LABS:  ?Last CBC ?Lab Results  ?Component Value Date  ? WBC 6.2 10/19/2021  ? HGB 11.8 (L) 10/19/2021  ? HCT 36.1 10/19/2021  ? MCV 88.1 10/19/2021  ? RDW 13.7 10/19/2021  ? PLT 248.0 10/19/2021  ? ?Last metabolic panel ?Lab Results  ?Component Value Date  ? GLUCOSE 85 10/19/2021  ? NA 139 10/19/2021  ? K 4.0 10/19/2021  ? CL 102 10/19/2021  ? CO2 32 10/19/2021  ? BUN 13 10/19/2021  ? CREATININE 0.99 10/19/2021  ? GFRNONAA 41 03/01/2021  ? CALCIUM 9.3 10/19/2021  ? ALBUMIN 4.0 03/01/2021  ? ALKPHOS 64 03/01/2021  ? AST 18 03/01/2021  ? ALT 19 03/01/2021  ? ?Last lipids ?Lab Results  ?Component Value Date  ? CHOL 193 10/19/2021  ? HDL 56.60 10/19/2021  ? LDLCALC 116 (H) 10/19/2021  ? TRIG 101.0 10/19/2021  ? CHOLHDL 3 10/19/2021  ? ?Last thyroid functions ?Lab Results  ?Component Value Date  ? TSH 2.10 03/01/2021  ? ?IMPRESSION AND PLAN: ? ?#1 chronic bilateral shoulder pain, right greater than left. ?Bedside ultrasound today did confirm thickening of the peribursal fat in subacromial/subdeltoid region.   Mild diffuse thickening of the supraspinatus with small intrasubstance tears.  No fluid around biceps tendon.  AC joint with mild distention of capsule, arthritic changes noted. ? ?I think she has subacromial bursitis superimposed on chronic rotator cuff tendinopathy. ?Additionally, she has AC joint arthritis and suspect glenohumeral joint arthritis. ?We will check plain films of both shoulders. ? ?Patient was in favor of subacromial/subdeltoid bursa steroid injection today. ? ?  Ultrasound-guided injection is preferred based on studies that show increased duration, increased effect, greater accuracy, decreased procedural pain, increased response rate, and decreased cost with ultrasound-guided versus blind injection. ?Procedure: Real-time ultrasound guided injection of Right subacrom/subdelt bursa. ?Device: GE [employer] E ?Verbal informed consent obtained.  Timeout conducted.  No overlying erythema, induration, or other signs of local infection. ?After sterile prep with Betadine, injected 2 cc of 1% lidocaine without epinephrine and 40mg  kenalog using 25G 1 and 1/2 " needle, passing the needle from lateral to medial approach into subacromial/subdelt bursa.  Injectate seen filling/distending bursa. ?Patient tolerated the procedure well.  No immediate complications.  Post-injection care discussed. ?Advised to call if fever/chills, erythema, drainage, or persistent bleeding. ? ?Impression: Technically successful ultrasound-guided injection. ? ?2 chronic pain syndrome.  chronic low back pain due to degenerative disc disease, lumbar spinal stenosis with neurogenic claudication. ?Continue Vicodin 5-3 25, 1 twice daily as needed.  No new prescription was needed today. ?Continue meloxicam 15 mg/day. ? ?#3 hypertension. ?Well-controlled on amlodipine 10 mg a day and Lopressor 25 mg a day. ? ?#4 hyperlipidemia.  Increased atorvastatin to 20 mg a day about 3 months ago. ?Goal LDL less than 100. ?Plan repeat lipids and hepatic panel  in 3 months. ? 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  We injected 1 of these several months ago and it did help.  She notes that her pain has returned and i

## 2022-03-27 MED ORDER — HYDROCODONE-ACETAMINOPHEN 5-325 MG PO TABS: ORAL_TABLET | ORAL | 0 refills | Status: DC

## 2022-03-27 NOTE — Telephone Encounter (Signed)
[facility] that [facility] not a [facility] within 25 mile radius that has this medication in stock.  Patient requests prescription to be sent to [facility].   Patient can be reached at [phone]

## 2022-03-27 NOTE — Telephone Encounter (Signed)
Pt advised refill sent. °

## 2022-03-27 NOTE — Telephone Encounter (Signed)
Please resend rx to [facility]. Rx pending to pharmacy

## 2022-03-27 NOTE — Addendum Note (Signed)
Addended by: Emi Holes D on: 03/27/2022 12:03 PM   Modules accepted: Orders

## 2022-04-28 ENCOUNTER — Telehealth: Payer: Self-pay | Admitting: Family Medicine

## 2022-04-28 MED ORDER — HYDROCODONE-ACETAMINOPHEN 5-325 MG PO TABS: ORAL_TABLET | ORAL | 0 refills | Status: DC

## 2022-05-24 ENCOUNTER — Other Ambulatory Visit: Payer: Self-pay | Admitting: Family Medicine

## 2022-05-25 ENCOUNTER — Telehealth: Payer: Self-pay

## 2022-05-25 NOTE — Telephone Encounter (Signed)
Spoke with pt to confirm if mammogram has been completed. Pt had not completed recently because of insurance issues. She was given contact information for scheduling with mobile mammogram bus.

## 2022-05-29 ENCOUNTER — Telehealth: Payer: Self-pay

## 2022-05-29 MED ORDER — HYDROCODONE-ACETAMINOPHEN 5-325 MG PO TABS: ORAL_TABLET | ORAL | 0 refills | Status: DC

## 2022-05-29 NOTE — Telephone Encounter (Signed)
Norco rx sent.

## 2022-05-29 NOTE — Telephone Encounter (Signed)
Patient refill request.  [facility]  HYDROcodone-acetaminophen (NORCO/VICODIN) 5-325 MG tablet [[id number]]

## 2022-06-21 ENCOUNTER — Ambulatory Visit: Payer: Medicare Other | Admitting: Family Medicine

## 2022-06-21 ENCOUNTER — Other Ambulatory Visit: Payer: Self-pay | Admitting: Family Medicine

## 2022-06-28 ENCOUNTER — Ambulatory Visit (INDEPENDENT_AMBULATORY_CARE_PROVIDER_SITE_OTHER): Payer: Medicare Other | Admitting: Family Medicine

## 2022-06-28 ENCOUNTER — Encounter: Payer: Self-pay | Admitting: Family Medicine

## 2022-06-28 VITALS — BP 106/67 | HR 58 | Temp 97.9°F | Ht 60.5 in | Wt 147.4 lb

## 2022-06-28 DIAGNOSIS — Z131 Encounter for screening for diabetes mellitus: Secondary | ICD-10-CM | POA: Diagnosis not present

## 2022-06-28 DIAGNOSIS — E7800 Pure hypercholesterolemia, unspecified: Secondary | ICD-10-CM | POA: Diagnosis not present

## 2022-06-28 DIAGNOSIS — M21611 Bunion of right foot: Secondary | ICD-10-CM

## 2022-06-28 DIAGNOSIS — Z79899 Other long term (current) drug therapy: Secondary | ICD-10-CM | POA: Diagnosis not present

## 2022-06-28 DIAGNOSIS — M2041 Other hammer toe(s) (acquired), right foot: Secondary | ICD-10-CM | POA: Diagnosis not present

## 2022-06-28 DIAGNOSIS — G894 Chronic pain syndrome: Secondary | ICD-10-CM | POA: Diagnosis not present

## 2022-06-28 DIAGNOSIS — I10 Essential (primary) hypertension: Secondary | ICD-10-CM | POA: Diagnosis not present

## 2022-06-28 DIAGNOSIS — M7751 Other enthesopathy of right foot: Secondary | ICD-10-CM

## 2022-06-28 LAB — LIPID PANEL
Cholesterol: 157 mg/dL (ref 0–200)
HDL: 46.7 mg/dL (ref >39.00)
LDL Cholesterol: 88 mg/dL (ref 0–99)
NonHDL: 109.84
Total CHOL/HDL Ratio: 3
Triglycerides: 109 mg/dL (ref 0.0–149.0)
VLDL: 21.8 mg/dL (ref 0.0–40.0)

## 2022-06-28 LAB — COMPREHENSIVE METABOLIC PANEL
ALT: 13 U/L (ref 0–35)
AST: 19 U/L (ref 0–37)
Albumin: 3.7 g/dL (ref 3.5–5.2)
Alkaline Phosphatase: 54 U/L (ref 39–117)
BUN: 25 mg/dL — ABNORMAL HIGH (ref 6–23)
CO2: 27 mEq/L (ref 19–32)
Calcium: 8.6 mg/dL (ref 8.4–10.5)
Chloride: 102 mEq/L (ref 96–112)
Creatinine, Ser: 1.78 mg/dL — ABNORMAL HIGH (ref 0.40–1.20)
GFR: 27.77 mL/min — ABNORMAL LOW (ref >60.00)
Glucose, Bld: 76 mg/dL (ref 70–99)
Potassium: 5.2 mEq/L — ABNORMAL HIGH (ref 3.5–5.1)
Sodium: 137 mEq/L (ref 135–145)
Total Bilirubin: 0.6 mg/dL (ref 0.2–1.2)
Total Protein: 5.9 g/dL — ABNORMAL LOW (ref 6.0–8.3)

## 2022-06-28 LAB — HEMOGLOBIN A1C: Hgb A1c MFr Bld: 6.2 % (ref 4.6–6.5)

## 2022-06-28 MED ORDER — TRIAMCINOLONE ACETONIDE 40 MG/ML IJ SUSP
20.0000 mg | Freq: Once | INTRAMUSCULAR | Status: AC
  Administered 2022-06-28: 20 mg via INTRA_ARTICULAR

## 2022-06-28 MED ORDER — HYDROCODONE-ACETAMINOPHEN 5-325 MG PO TABS: ORAL_TABLET | ORAL | 0 refills | Status: DC

## 2022-06-28 NOTE — Progress Notes (Signed)
OFFICE VISIT  06/28/2022  CC:  Chief Complaint  Patient presents with   Hypertension   Hyperlipidemia    Patient is a 76 y.o. female who presents for 19-month follow-up chronic pain syndrome, hypertension, HLD. A/P as of last visit: "#1 chronic bilateral shoulder pain, right greater than left. Bedside ultrasound today did confirm thickening of the peribursal fat in subacromial/subdeltoid region.  Mild diffuse thickening of the supraspinatus with small intrasubstance tears.  No fluid around biceps tendon.  AC joint with mild distention of capsule, arthritic changes noted.   I think she has subacromial bursitis superimposed on chronic rotator cuff tendinopathy. Additionally, she has AC joint arthritis and suspect glenohumeral joint arthritis. We will check plain films of both shoulders. Patient was in favor of subacromial/subdeltoid bursa steroid injection today.  Ultrasound-guided injection is preferred based on studies that show increased duration, increased effect, greater accuracy, decreased procedural pain, increased response rate, and decreased cost with ultrasound-guided versus blind injection. Procedure: Real-time ultrasound guided injection of Right subacrom/subdelt bursa. Device: GE [employer] informed consent obtained.  Timeout conducted.  No overlying erythema, induration, or other signs of local infection. After sterile prep with Betadine, injected 2 cc of 1% lidocaine without epinephrine and 40mg  kenalog using 25G 1 and 1/2 " needle, passing the needle from lateral to medial approach into subacromial/subdelt bursa.  Injectate seen filling/distending bursa. Patient tolerated the procedure well.  No immediate complications.  Post-injection care discussed. Advised to call if fever/chills, erythema, drainage, or persistent bleeding.   Impression: Technically successful ultrasound-guided injection.   2 chronic pain syndrome.  chronic low back pain due to degenerative disc  disease, lumbar spinal stenosis with neurogenic claudication. Continue Vicodin 5-3 25, 1 twice daily as needed.  No new prescription was needed today. Continue meloxicam 15 mg/day.   #3 hypertension. Well-controlled on amlodipine 10 mg a day and Lopressor 25 mg a day.   #4 hyperlipidemia.  Increased atorvastatin to 20 mg a day about 3 months ago. Goal LDL less than 100. Plan repeat lipids and hepatic panel in 3 months.   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  We injected 1 of these several months ago and it did help.  She notes that her pain has returned and is gradually increasing.  She is not at the point of wanting an injection at this time but we may have to do this in the future"  INTERIM HX: Her chronic low back pain is about the same as usual. She has gradual worsening of right foot pain that focuses in the second toe MTP joint but also involves big toe bunion.  This pain is given her quite a bit of problem on and off over at least the last year.  Chronic bilateral hand pain and stiffness, unchanged.  Her pain in her foot has caused her a little bit more difficulty lately with gait stability, recently has been falling a bit more.  PMP AWARE reviewed today: most recent rx for Vicodin was filled 05/29/2022, #60, rx by me. No red flags.  Past Medical History:  Diagnosis Date   Anxiety and depression    Bilateral edema of lower extremity    Chronic pain syndrome    Dr. 05/31/2022   Chronic renal insufficiency, stage 3 (moderate) (HCC)    GFR 40sd   Frequent headaches    GERD (gastroesophageal reflux disease)    History of sepsis    UTI/admission  Hypercholesterolemia    Hypertension    Insomnia    Iron deficiency anemia    ?etiology.   Osteoarthritis, multiple sites     Past Surgical History:  Procedure Laterality Date   APPENDECTOMY  1986   COLONOSCOPY  2018   normal ([facility] per pt report but that office has no  records of her seeing them)   ESOPHAGOGASTRODUODENOSCOPY     neg for bleeding ?eval    Outpatient Medications Prior to Visit  Medication Sig Dispense Refill   ALPRAZolam (XANAX) 1 MG tablet Take 1 tablet (1 mg total) by mouth 3 (three) times daily. 90 tablet 5   amLODipine (NORVASC) 10 MG tablet Take 1 tablet (10 mg total) by mouth daily. 90 tablet 3   Ascorbic Acid (VITAMIN C PO) Take by mouth daily.     aspirin 81 MG chewable tablet Chew 81 mg by mouth daily in the afternoon.     atorvastatin (LIPITOR) 20 MG tablet TAKE 1 TABLET BY MOUTH EVERY DAY 90 tablet 1   Cholecalciferol (VITAMIN D3 PO) Take 50 mg by mouth daily.     Cyanocobalamin (VITAMIN B-12 PO) Take by mouth daily.     fluticasone (FLONASE) 50 MCG/ACT nasal spray Place 1 spray into both nostrils daily. 48 g 3   gabapentin (NEURONTIN) 100 MG capsule Take 1 capsule (100 mg total) by mouth in the morning, at noon, in the evening, and at bedtime. 360 capsule 1   Iron (IRCON PO) Take 65 mg by mouth daily.     meloxicam (MOBIC) 15 MG tablet Take 1 tablet (15 mg total) by mouth daily. 90 tablet 1   metoprolol tartrate (LOPRESSOR) 25 MG tablet TAKE 1 TABLET BY MOUTH TWICE A DAY 180 tablet 1   Omega-3 Fatty Acids (FISH OIL PO) Take by mouth daily.     pantoprazole (PROTONIX) 40 MG tablet Take 1 tablet (40 mg total) by mouth daily. Take 1 tablet by mouth daily. 90 tablet 3   traZODone (DESYREL) 50 MG tablet Take 1 tablet (50 mg total) by mouth daily. 90 tablet 3   VITAMIN E PO Take by mouth daily.     HYDROcodone-acetaminophen (NORCO/VICODIN) 5-325 MG tablet 1 tab po bid prn pain 60 tablet 0   No facility-administered medications prior to visit.    Allergies  Allergen Reactions   Tramadol Itching    ROS As per HPI  PE:    06/28/2022   10:24 AM 03/22/2022   11:09 AM 12/21/2021   10:27 AM  Vitals with BMI  Height 5' 0.5" 5' 0.5" 5' 0.5"  Weight 147 lbs 6 oz 148 lbs 13 oz 147 lbs  BMI 28.3 28.57 28.23  Systolic 106 93 137   Diastolic 67 56 71  Pulse 58 54 56     Physical Exam  Gen: Alert, well appearing.  Patient is oriented to person, place, time, and situation.. Right foot second toe tender to palpation significantly over the MTP joint, with moderate hammertoe deformity. Mild tenderness to palpation over the first toe MTP as well.  No erythema or warmth.  Mild bunion deformity.  LABS:  Last CBC Lab Results  Component Value Date   WBC 6.2 10/19/2021   HGB 11.8 (L) 10/19/2021   HCT 36.1 10/19/2021   MCV 88.1 10/19/2021   RDW 13.7 10/19/2021   PLT 248.0 10/19/2021   Last metabolic panel Lab Results  Component Value Date   GLUCOSE 76 06/28/2022   NA 137 06/28/2022  K 5.2 No hemolysis seen (H) 06/28/2022   CL 102 06/28/2022   CO2 27 06/28/2022   BUN 25 (H) 06/28/2022   CREATININE 1.78 (H) 06/28/2022   GFRNONAA 41 03/01/2021   CALCIUM 8.6 06/28/2022   PROT 5.9 (L) 06/28/2022   ALBUMIN 3.7 06/28/2022   BILITOT 0.6 06/28/2022   ALKPHOS 54 06/28/2022   AST 19 06/28/2022   ALT 13 06/28/2022   Last lipids Lab Results  Component Value Date   CHOL 157 06/28/2022   HDL 46.70 06/28/2022   LDLCALC 88 06/28/2022   TRIG 109.0 06/28/2022   CHOLHDL 3 06/28/2022   Last thyroid functions Lab Results  Component Value Date   TSH 2.10 03/01/2021   IMPRESSION AND PLAN:  Chronic/recurrent right second toe pain as well as pain in right bunion deformity. Second toe MCP capsulitis--injection of steroid helped in the past and she was in favor of this again today as well as referral to a podiatrist. Ordered referral.  Ultrasound-guided injection is preferred based on studies that show increased duration, increased effect, greater accuracy, decreased procedural pain, increased response rate, and decreased cost with ultrasound-guided versus blind injection. Procedure: Real-time ultrasound guided injection of right foot second toe MTP joint. Device: GE [employer] informed consent obtained.  Timeout  conducted.  No overlying erythema, induration, or other signs of local infection. After sterile prep with Betadine, injected 20 mg triamcinolone +1/2 cc of 1% plain lidocaine using 25-gauge 1-1/2 inch needle,.  Injectate seen filling joint capsule. Patient tolerated the procedure well.  No immediate complications.  Post-injection care discussed. Advised to call if fever/chills, erythema, drainage, or persistent bleeding.  Impression: Technically successful ultrasound-guided injection.  #2 chronic pain syndrome. Chronic low back pain due to degenerative disc disease, lumbar spinal stenosis with neurogenic claudication. Continue Vicodin 5-3 25, 1 twice daily as needed, #60.  Controlled substance contract up-to-date. We will do repeat urine drug screen at next follow-up visit in 3 months.  3.  Hypertension, well controlled on amlodipine 10 mg/day, and Lopressor 25 mg twice a day. Check electrolytes and creatinine today.  #4 hypercholesterolemia. Doing well on atorvastatin 20 mg a day. Lipid panel and hepatic panel today.  #5 screening for diabetes.  Patient has strong family history. Hemoglobin A1c today.  An After Visit Summary was printed and given to the patient.  FOLLOW UP: Return in about 3 months (around 09/28/2022) for routine chronic illness f/u. Cpe 59mo  Signed:  Santiago Bumpers, MD           06/28/2022

## 2022-06-29 ENCOUNTER — Telehealth: Payer: Self-pay | Admitting: Family Medicine

## 2022-06-29 ENCOUNTER — Telehealth: Payer: Self-pay

## 2022-06-29 DIAGNOSIS — E875 Hyperkalemia: Secondary | ICD-10-CM

## 2022-06-29 DIAGNOSIS — R7989 Other specified abnormal findings of blood chemistry: Secondary | ICD-10-CM

## 2022-06-29 NOTE — Telephone Encounter (Signed)
Patient called back about results

## 2022-06-29 NOTE — Telephone Encounter (Signed)
LM for pt to reschedule lab visit 7-10 days from now. She does not need to be fasting

## 2022-06-29 NOTE — Telephone Encounter (Signed)
-----   Message from Jeoffrey Massed, MD sent at 06/29/2022 11:32 AM EDT ----- I forgot to say that her hemoglobin A1c was in the prediabetic range, 6.2%. Continue to work on improving diet and increasing physical activity to decrease her chances of progression to type 2 diabetes.

## 2022-06-29 NOTE — Telephone Encounter (Signed)
Patient advised of results.

## 2022-06-29 NOTE — Telephone Encounter (Signed)
Pt wanted me to inform you that she can not do next Thursday due to having an appointment with the Podiatrist and would like to schedule a different day.

## 2022-06-29 NOTE — Telephone Encounter (Signed)
Per provider recheck basic metabolic panel in 7 to 10 days, diagnosis elevated serum creatinine and hyperkalemia.

## 2022-06-30 NOTE — Telephone Encounter (Signed)
Patient's appointment rescheduled yesterday to 8/30

## 2022-07-05 ENCOUNTER — Other Ambulatory Visit (INDEPENDENT_AMBULATORY_CARE_PROVIDER_SITE_OTHER): Payer: Medicare Other

## 2022-07-05 DIAGNOSIS — E875 Hyperkalemia: Secondary | ICD-10-CM

## 2022-07-05 DIAGNOSIS — R7989 Other specified abnormal findings of blood chemistry: Secondary | ICD-10-CM | POA: Diagnosis not present

## 2022-07-05 LAB — BASIC METABOLIC PANEL
BUN: 28 mg/dL — ABNORMAL HIGH (ref 6–23)
CO2: 25 mEq/L (ref 19–32)
Calcium: 8.8 mg/dL (ref 8.4–10.5)
Chloride: 104 mEq/L (ref 96–112)
Creatinine, Ser: 1.47 mg/dL — ABNORMAL HIGH (ref 0.40–1.20)
GFR: 34.93 mL/min — ABNORMAL LOW (ref >60.00)
Glucose, Bld: 69 mg/dL — ABNORMAL LOW (ref 70–99)
Potassium: 4.7 mEq/L (ref 3.5–5.1)
Sodium: 138 mEq/L (ref 135–145)

## 2022-07-06 ENCOUNTER — Ambulatory Visit: Payer: Medicare Other | Admitting: Podiatry

## 2022-07-06 ENCOUNTER — Ambulatory Visit (INDEPENDENT_AMBULATORY_CARE_PROVIDER_SITE_OTHER): Payer: Medicare Other

## 2022-07-06 ENCOUNTER — Encounter: Payer: Self-pay | Admitting: Podiatry

## 2022-07-06 ENCOUNTER — Telehealth: Payer: Self-pay

## 2022-07-06 ENCOUNTER — Other Ambulatory Visit: Payer: Medicare Other

## 2022-07-06 DIAGNOSIS — M7731 Calcaneal spur, right foot: Secondary | ICD-10-CM | POA: Diagnosis not present

## 2022-07-06 DIAGNOSIS — M2011 Hallux valgus (acquired), right foot: Secondary | ICD-10-CM | POA: Diagnosis not present

## 2022-07-06 DIAGNOSIS — M2041 Other hammer toe(s) (acquired), right foot: Secondary | ICD-10-CM

## 2022-07-06 DIAGNOSIS — M19071 Primary osteoarthritis, right ankle and foot: Secondary | ICD-10-CM | POA: Diagnosis not present

## 2022-07-06 DIAGNOSIS — N183 Chronic kidney disease, stage 3 unspecified: Secondary | ICD-10-CM

## 2022-07-06 DIAGNOSIS — M7751 Other enthesopathy of right foot: Secondary | ICD-10-CM | POA: Diagnosis not present

## 2022-07-06 DIAGNOSIS — M21611 Bunion of right foot: Secondary | ICD-10-CM

## 2022-07-06 DIAGNOSIS — N189 Chronic kidney disease, unspecified: Secondary | ICD-10-CM | POA: Diagnosis not present

## 2022-07-06 DIAGNOSIS — I129 Hypertensive chronic kidney disease with stage 1 through stage 4 chronic kidney disease, or unspecified chronic kidney disease: Secondary | ICD-10-CM | POA: Diagnosis not present

## 2022-07-06 NOTE — Progress Notes (Signed)
  Subjective:  Patient ID: Sheila Newman, female    DOB: 01-19-1946,   MRN: [id number]  Chief Complaint  Patient presents with   Bunions    Right foot bunion / hammertoe    76 y.o. female presents for concern of right foot bunion and hammertoe that have been present for years. PCP recently gave her an injection last week into the toe for some inflammation. Has had a couple shots in this area in the past. Relates pain when walking. She is not a diabetic. Denies any other pedal complaints. Denies n/v/f/c.   Past Medical History:  Diagnosis Date   Anxiety and depression    Bilateral edema of lower extremity    Chronic pain syndrome    Dr. Laurian Brim   Chronic renal insufficiency, stage 3 (moderate) (HCC)    GFR 40sd   Frequent headaches    GERD (gastroesophageal reflux disease)    History of sepsis    UTI/admission   Hypercholesterolemia    Hypertension    Insomnia    Iron deficiency anemia    ?etiology.   Osteoarthritis, multiple sites     Objective:  Physical Exam: Vascular: DP/PT pulses 2/4 bilateral. CFT <3 seconds. Normal hair growth on digits. No edema.  Skin. No lacerations or abrasions bilateral feet.  Musculoskeletal: MMT 5/5 bilateral lower extremities in DF, PF, Inversion and Eversion. Deceased ROM in DF of ankle joint. 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 Tender over medial eminence and tender to plantar second MPJ. Pain with ROM of the digit and tenderness to palpation of the dorsal digit.  Neurological: Sensation intact to light touch.   Assessment:   1. Hammer toe of right foot   2. Bunion, right   3. Capsulitis of toe, right      Plan:  Patient was evaluated and treated and all questions answered. Discussed capsulitis and inflammation of joint and treatment options with patient. Also discussed bunions and hammertoes.  Radiographs reviewed and discussed with patient. No acute fractures or dislocations. Moderate HAV deformity noted with  hammered second digit.  Injection deferred today as she had one last week.  Discussed padding and offloading of area on second toe.  Unable to take NSAIDs currently due to kidney function.  Discussed supportive shoes and shoe gear.  Discussed CMO and patient will consider.  Discussed if pain does not improve may consider PT and/or MRI for further surgical planning.  Patient to return as needed   Louann Sjogren, DPM

## 2022-07-06 NOTE — Telephone Encounter (Signed)
-----   Message from Jeoffrey Massed, MD sent at 07/06/2022  9:09 AM EDT ----- Potassium normal now but kidney function is still down compared to her previous baseline.  I recommend she stop meloxicam completely and not take any otc med such as advil, ibup, aleve. I'd like her to get a renal ultrasound, dx chronic renal insufficiency stage III, any location of her choice. Repeat bmet 3 wks, same dx.

## 2022-07-21 ENCOUNTER — Other Ambulatory Visit: Payer: Self-pay | Admitting: Family Medicine

## 2022-07-26 ENCOUNTER — Other Ambulatory Visit (INDEPENDENT_AMBULATORY_CARE_PROVIDER_SITE_OTHER): Payer: Medicare Other

## 2022-07-26 DIAGNOSIS — N183 Chronic kidney disease, stage 3 unspecified: Secondary | ICD-10-CM | POA: Diagnosis not present

## 2022-07-26 LAB — BASIC METABOLIC PANEL
BUN: 16 mg/dL (ref 6–23)
CO2: 28 mEq/L (ref 19–32)
Calcium: 8.8 mg/dL (ref 8.4–10.5)
Chloride: 104 mEq/L (ref 96–112)
Creatinine, Ser: 1.16 mg/dL (ref 0.40–1.20)
GFR: 46.39 mL/min — ABNORMAL LOW (ref >60.00)
Glucose, Bld: 100 mg/dL — ABNORMAL HIGH (ref 70–99)
Potassium: 4.1 mEq/L (ref 3.5–5.1)
Sodium: 140 mEq/L (ref 135–145)

## 2022-07-27 ENCOUNTER — Encounter: Payer: Self-pay | Admitting: Family Medicine

## 2022-07-28 ENCOUNTER — Telehealth: Payer: Self-pay

## 2022-07-28 MED ORDER — HYDROCODONE-ACETAMINOPHEN 5-325 MG PO TABS: ORAL_TABLET | ORAL | 0 refills | Status: DC

## 2022-07-28 NOTE — Telephone Encounter (Signed)
Patient refill request.  [facility] [place]   HYDROcodone-acetaminophen (NORCO/VICODIN) 5-325 MG tablet [[id number]]

## 2022-07-28 NOTE — Telephone Encounter (Signed)
Ok, vicodin rx sent

## 2022-08-28 ENCOUNTER — Other Ambulatory Visit: Payer: Self-pay

## 2022-08-28 MED ORDER — HYDROCODONE-ACETAMINOPHEN 5-325 MG PO TABS: ORAL_TABLET | ORAL | 0 refills | Status: DC

## 2022-08-28 NOTE — Telephone Encounter (Signed)
Pt advised refill sent. She received a text stating due to state limitations, your rx cannot be filled. Will follow up with pharmacy after their lunch break.

## 2022-08-28 NOTE — Telephone Encounter (Signed)
Requesting: Norco Contract: 10/19/21 UDS: 09/28/21 Last Visit: 06/28/22 Next Visit: 10/04/22 Last Refill: 07/21/22 (60,0)  Please Advise. Med pending

## 2022-08-28 NOTE — Telephone Encounter (Signed)
Spoke with pharmacist, Jenny Reichmann regarding rx. May have to contact insurance but pt will receive update within the next hour about rx pick up

## 2022-09-04 ENCOUNTER — Other Ambulatory Visit: Payer: Self-pay | Admitting: Family Medicine

## 2022-09-07 ENCOUNTER — Ambulatory Visit: Payer: Medicare Other | Admitting: Podiatry

## 2022-09-07 DIAGNOSIS — M2011 Hallux valgus (acquired), right foot: Secondary | ICD-10-CM | POA: Diagnosis not present

## 2022-09-07 DIAGNOSIS — M21961 Unspecified acquired deformity of right lower leg: Secondary | ICD-10-CM

## 2022-09-07 DIAGNOSIS — M21611 Bunion of right foot: Secondary | ICD-10-CM | POA: Diagnosis not present

## 2022-09-07 DIAGNOSIS — E559 Vitamin D deficiency, unspecified: Secondary | ICD-10-CM

## 2022-09-07 DIAGNOSIS — M2041 Other hammer toe(s) (acquired), right foot: Secondary | ICD-10-CM | POA: Diagnosis not present

## 2022-09-07 NOTE — Progress Notes (Signed)
  Subjective:  Patient ID: Sheila Newman, female    DOB: 1946-07-25,  MRN: [id number]  Chief Complaint  Patient presents with   Hammer Toe    right foot hammer/bunion toe pain-sx consult   Bunions    76 y.o. female presents with the above complaint. History confirmed with patient.  She returns for follow-up of her right foot bunion that has been very painful.  The second toe is the most painful area especially on the bottom of the foot.  Objective:  Physical Exam: warm, good capillary refill, no trophic changes or ulcerative lesions, normal DP and PT pulses, normal sensory exam, and on the right foot she has hallux valgus deformity with pain and crepitance with range of motion of the joint, she has pain on the plantar second MTPJ as well as a contracted hammertoe deformity, reducible hammertoes of the lesser toes.   Radiographs: Multiple views x-ray of the right foot: Previous right foot radiographs were reviewed she has hallux valgus deformity with contracture of the second toe, arthritic changes of the first MPJ  Assessment:   1. Hallux valgus with bunions, right   2. Hammertoe of right foot   3. Deformity of metatarsal bone of right foot   4. Vitamin D deficiency      Plan:  Patient was evaluated and treated and all questions answered.  Discussed the etiology and treatment including surgical and non surgical treatment for painful bunions.  She has exhausted all non surgical treatment prior to this visit including shoe gear changes and padding. She desires surgical intervention. We discussed all risks including but not limited to: pain, swelling, infection, scar, numbness which may be temporary or permanent, chronic pain, stiffness, nerve pain or damage, wound healing problems, bone healing problems including delayed or non-union and recurrence. Specifically we discussed the following procedures: Right foot first metatarsal phalangeal joint arthrodesis with bone graft from the heel,  second metatarsal osteotomy shortening and hammertoe correction of the second toe, possible tendon transfer flexor tendon of the second toe.  We discussed rationale for such procedure as well as expected postoperative courses.  Informed consent was signed today. Surgery will be scheduled at a mutually agreeable date. Information regarding this will be forwarded to our surgery scheduler.  She has not had her vitamin D checked in some time as well.  I recommended check of this, she takes a daily supplement   Surgical plan:  Procedure: -Right foot first metatarsal phalangeal joint arthrodesis with bone graft from the heel, second metatarsal osteotomy shortening and hammertoe correction of the second toe, possible tendon transfer flexor tendon of the second toe  Location: -[facility]  Anesthesia plan: -IV sedation with regional block  Postoperative pain plan: - Tylenol 1000 mg every 6 hours, gabapentin 300 mg every 8 hours x5 days, oxycodone 5 mg 1-2 tabs every 6 hours only as needed  DVT prophylaxis: -ASA 325 mg twice daily  WB Restrictions / DME needs: -NWB in CAM boot postop, this was dispensed today.  Recommended to her she should obtain a knee scooter.   No follow-ups on file.

## 2022-09-08 LAB — CALCIUM: Calcium: 9.2 mg/dL (ref 8.7–10.3)

## 2022-09-08 LAB — VITAMIN D 25 HYDROXY (VIT D DEFICIENCY, FRACTURES): Vit D, 25-Hydroxy: 37.7 ng/mL (ref 30.0–100.0)

## 2022-09-22 ENCOUNTER — Other Ambulatory Visit: Payer: Self-pay | Admitting: Family Medicine

## 2022-09-22 NOTE — Telephone Encounter (Signed)
Pt advised refill sent. °

## 2022-09-22 NOTE — Telephone Encounter (Signed)
Sheila Newman called her pharmacy and was informed that her prescription for alprazolam was expired. Please advise patient.

## 2022-09-22 NOTE — Telephone Encounter (Signed)
Requesting: alprazolam Contract: 10/19/21 UDS: 09/28/21 Last Visit: 06/28/22 Next Visit: 10/04/22 Last Refill: 03/22/22(90,5)  Please Advise. Med pending

## 2022-09-27 ENCOUNTER — Telehealth: Payer: Self-pay | Admitting: Podiatry

## 2022-09-27 NOTE — Telephone Encounter (Signed)
DOS: 10/27/2022  [employer] Medicare Effective 11/06/2021  Metatarsal Osteotomy 2nd Rt (07680) Hammertoe Repair 2nd Rt (88110) Hallux MPJ Fusion Rt (31594) Bone Graft Rt (20900)  DX: M21.541, M20.41, M20.11, and M89.771  Deductible: $0 Out-of-Pocket: $3,600 with $3,389.88 remaining CoInsurance: 0%  Prior authorization is not required per [facility] website.  Decision ID #:[id number]

## 2022-10-02 ENCOUNTER — Telehealth: Payer: Self-pay | Admitting: Family Medicine

## 2022-10-02 MED ORDER — HYDROCODONE-ACETAMINOPHEN 5-325 MG PO TABS: ORAL_TABLET | ORAL | 0 refills | Status: DC

## 2022-10-02 NOTE — Telephone Encounter (Signed)
Requesting: norco  Contract: 10/19/21  UDS:09/28/21  Last Visit: 06/28/22  Next Visit: 10/04/22  Last Refill:06/28/22 (60,0)   Please Advise

## 2022-10-02 NOTE — Telephone Encounter (Signed)
Noted  

## 2022-10-02 NOTE — Telephone Encounter (Signed)
Pt has an appointment on Wed 11/29 however she states she needs a refill on her Hydrocodone.

## 2022-10-02 NOTE — Telephone Encounter (Signed)
Norco rx sent.

## 2022-10-04 ENCOUNTER — Ambulatory Visit (INDEPENDENT_AMBULATORY_CARE_PROVIDER_SITE_OTHER): Payer: Medicare Other | Admitting: Family Medicine

## 2022-10-04 ENCOUNTER — Encounter: Payer: Self-pay | Admitting: Family Medicine

## 2022-10-04 VITALS — BP 119/74 | HR 65 | Temp 97.8°F | Ht 60.5 in | Wt 146.6 lb

## 2022-10-04 DIAGNOSIS — M25511 Pain in right shoulder: Secondary | ICD-10-CM | POA: Diagnosis not present

## 2022-10-04 DIAGNOSIS — I10 Essential (primary) hypertension: Secondary | ICD-10-CM

## 2022-10-04 DIAGNOSIS — Z79899 Other long term (current) drug therapy: Secondary | ICD-10-CM

## 2022-10-04 DIAGNOSIS — G894 Chronic pain syndrome: Secondary | ICD-10-CM | POA: Diagnosis not present

## 2022-10-04 DIAGNOSIS — M25512 Pain in left shoulder: Secondary | ICD-10-CM | POA: Diagnosis not present

## 2022-10-04 DIAGNOSIS — G8929 Other chronic pain: Secondary | ICD-10-CM

## 2022-10-04 DIAGNOSIS — N1831 Chronic kidney disease, stage 3a: Secondary | ICD-10-CM

## 2022-10-04 DIAGNOSIS — M545 Low back pain, unspecified: Secondary | ICD-10-CM | POA: Diagnosis not present

## 2022-10-04 DIAGNOSIS — M549 Dorsalgia, unspecified: Secondary | ICD-10-CM

## 2022-10-04 DIAGNOSIS — R7303 Prediabetes: Secondary | ICD-10-CM

## 2022-10-04 LAB — BASIC METABOLIC PANEL
BUN: 14 mg/dL (ref 6–23)
CO2: 29 mEq/L (ref 19–32)
Calcium: 8.7 mg/dL (ref 8.4–10.5)
Chloride: 106 mEq/L (ref 96–112)
Creatinine, Ser: 1.04 mg/dL (ref 0.40–1.20)
GFR: 52.82 mL/min — ABNORMAL LOW (ref >60.00)
Glucose, Bld: 83 mg/dL (ref 70–99)
Potassium: 4 mEq/L (ref 3.5–5.1)
Sodium: 140 mEq/L (ref 135–145)

## 2022-10-04 LAB — POCT GLYCOSYLATED HEMOGLOBIN (HGB A1C)
HbA1c POC (<> result, manual entry): 5.4 % (ref 4.0–5.6)
HbA1c, POC (controlled diabetic range): 5.4 % (ref 0.0–7.0)
HbA1c, POC (prediabetic range): 5.4 % — AB (ref 5.7–6.4)
Hemoglobin A1C: 5.4 % (ref 4.0–5.6)

## 2022-10-04 MED ORDER — GABAPENTIN 100 MG PO CAPS: 100.0000 mg | ORAL_CAPSULE | Freq: Four times a day (QID) | ORAL | 1 refills | Status: DC

## 2022-10-04 MED ORDER — PANTOPRAZOLE SODIUM 40 MG PO TBEC: 40.0000 mg | DELAYED_RELEASE_TABLET | Freq: Every day | ORAL | 1 refills | Status: DC

## 2022-10-04 NOTE — Progress Notes (Signed)
OFFICE VISIT  10/04/2022  CC:  Chief Complaint  Patient presents with   Hypertension   Chronic Kidney Disease   Prediabetes   HPI:    Patient is a 76 y.o. female who presents for 54-month follow-up chronic pain syndrome, CRI III, prediabetes, and hypertension. A/P as of last visit: "1. Chronic/recurrent right second toe pain as well as pain in right bunion deformity. Second toe MCP capsulitis--injection of steroid helped in the past and she was in favor of this again today as well as referral to a podiatrist. Ordered referral.   Ultrasound-guided injection is preferred based on studies that show increased duration, increased effect, greater accuracy, decreased procedural pain, increased response rate, and decreased cost with ultrasound-guided versus blind injection. Procedure: Real-time ultrasound guided injection of right foot second toe MTP joint. Device: [employer] informed consent obtained.  Timeout conducted.  No overlying erythema, induration, or other signs of local infection. After sterile prep with Betadine, injected 20 mg triamcinolone +1/2 cc of 1% plain lidocaine using 25-gauge 1-1/2 inch needle,.  Injectate seen filling joint capsule. Patient tolerated the procedure well.  No immediate complications.  Post-injection care discussed. Advised to call if fever/chills, erythema, drainage, or persistent bleeding.   Impression: Technically successful ultrasound-guided injection.   #2 chronic pain syndrome. Chronic low back pain due to degenerative disc disease, lumbar spinal stenosis with neurogenic claudication. Continue Vicodin 5-3 25, 1 twice daily as needed, #60.  Controlled substance contract up-to-date. We will do repeat urine drug screen at next follow-up visit in 3 months.   3.  Hypertension, well controlled on amlodipine 10 mg/day, and Lopressor 25 mg twice a day. Check electrolytes and creatinine today.   #4 hypercholesterolemia. Doing well on atorvastatin  20 mg a day. Lipid panel and hepatic panel today.   #5 screening for diabetes.  Patient has strong family history. Hemoglobin A1c today."  INTERIM HX: Pt is set for R foot bunion and hammertoe surgery in about a month. The pain from this is severe and constant.  Also, a work she slipped backwards and hit her back 3 wks ago, pain in mid back after, plus her chronic LBP.  Pain does not radiate down the legs.  No paresthesias or focal leg weakness.  Labs last visit showed hemoglobin A1c 6.2%. Trying to make some dietary improvements.  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 Medication and dose: vicodin 5/325, 1 bid # pills per month: 60 PMP AWARE reviewed today: most recent rx for Vicodin was filled 10/02/2022, #60, rx by me. Most recent alprazolam prescription filled 09/22/2022, #90, prescription by me. No red flags.   Past Medical History:  Diagnosis Date   Anxiety and depression    Bilateral edema of lower extremity    Chronic pain syndrome    Dr. Laurian Brim   Chronic renal insufficiency, stage 3 (moderate) (HCC)    GFR 40s.  Renal u/s normal 06/2022   Frequent headaches    GERD (gastroesophageal reflux disease)    History of sepsis    UTI/admission   Hypercholesterolemia    Hypertension    Insomnia    Iron deficiency anemia    ?etiology.   Osteoarthritis, multiple sites     Past Surgical History:  Procedure Laterality Date   APPENDECTOMY  1986   COLONOSCOPY  2018   normal ([facility] spec per pt report but that office has no records of her seeing them)  ESOPHAGOGASTRODUODENOSCOPY     neg for bleeding ?eval    Outpatient Medications Prior to Visit  Medication Sig Dispense Refill   ALPRAZolam (XANAX) 1 MG tablet TAKE 1 TABLET BY MOUTH 3 TIMES DAILY. 90 tablet 5   amLODipine (NORVASC) 10 MG tablet Take 1 tablet (10 mg total) by mouth daily. 90 tablet 3   Ascorbic Acid (VITAMIN C PO)  Take by mouth daily.     aspirin 81 MG chewable tablet Chew 81 mg by mouth daily in the afternoon.     atorvastatin (LIPITOR) 20 MG tablet TAKE 1 TABLET BY MOUTH EVERY DAY 90 tablet 0   Cholecalciferol (VITAMIN D3 PO) Take 50 mg by mouth daily.     Cyanocobalamin (VITAMIN B-12 PO) Take by mouth daily.     fluticasone (FLONASE) 50 MCG/ACT nasal spray Place 1 spray into both nostrils daily. 48 g 3   HYDROcodone-acetaminophen (NORCO/VICODIN) 5-325 MG tablet 1 tab po bid prn pain 60 tablet 0   Iron (IRCON PO) Take 65 mg by mouth daily.     metoprolol tartrate (LOPRESSOR) 25 MG tablet TAKE 1 TABLET BY MOUTH TWICE A DAY 180 tablet 1   Omega-3 Fatty Acids (FISH OIL PO) Take by mouth daily.     traZODone (DESYREL) 50 MG tablet Take 1 tablet (50 mg total) by mouth daily. 90 tablet 3   VITAMIN E PO Take by mouth daily.     gabapentin (NEURONTIN) 100 MG capsule Take 1 capsule (100 mg total) by mouth in the morning, at noon, in the evening, and at bedtime. 360 capsule 1   pantoprazole (PROTONIX) 40 MG tablet Take 1 tablet (40 mg total) by mouth daily. Take 1 tablet by mouth daily. 90 tablet 3   meloxicam (MOBIC) 15 MG tablet Take 1 tablet (15 mg total) by mouth daily. (Patient not taking: Reported on 10/04/2022) 90 tablet 1   No facility-administered medications prior to visit.    Allergies  Allergen Reactions   Tramadol Itching    ROS As per HPI  PE:    10/04/2022   10:46 AM 06/28/2022   10:24 AM 03/22/2022   11:09 AM  Vitals with BMI  Height 5' 0.5" 5' 0.5" 5' 0.5"  Weight 146 lbs 10 oz 147 lbs 6 oz 148 lbs 13 oz  BMI 28.15 28.3 28.57  Systolic 119 106 93  Diastolic 74 67 56  Pulse 65 58 54     Physical Exam  Gen: Alert, well appearing.  Patient is oriented to person, place, time, and situation. AFFECT: pleasant, lucid thought and speech. She has mild tenderness to palpation diffusely in the mid thoracic spine down to the lower lumbar spine.  She does have some of this tenderness  centrally over the spinous processes and facets. Both shoulders with significant tender to palpation over [facility] joint as well as anterolateral acromion region.  Positive Faber bilaterally, equivocal Neer bilaterally, O'Brien's positive bilaterally.  External rotation moderately limited bilaterally.  Strength 5 out of 5 proximally distally bilaterally upper extremities. Fingers show some mild pinkish discoloration of the MCPs, PIPs, and DIPs worse on the right hand.  Mild tenderness to squeeze bilaterally.  Some nodularity palpable over the flexor tendons on the palm of the right hand.  No contracture.  LABS:  Last CBC Lab Results  Component Value Date   WBC 6.2 10/19/2021   HGB 11.8 (L) 10/19/2021   HCT 36.1 10/19/2021   MCV 88.1 10/19/2021   RDW 13.7 10/19/2021   PLT  248.0 10/19/2021   Lab Results  Component Value Date   IRON 60 10/19/2021   TIBC 252 10/19/2021   FERRITIN 53 10/19/2021   Last metabolic panel Lab Results  Component Value Date   GLUCOSE 83 10/04/2022   NA 140 10/04/2022   K 4.0 10/04/2022   CL 106 10/04/2022   CO2 29 10/04/2022   BUN 14 10/04/2022   CREATININE 1.04 10/04/2022   GFRNONAA 41 03/01/2021   CALCIUM 8.7 10/04/2022   PROT 5.9 (L) 06/28/2022   ALBUMIN 3.7 06/28/2022   BILITOT 0.6 06/28/2022   ALKPHOS 54 06/28/2022   AST 19 06/28/2022   ALT 13 06/28/2022   Last lipids Lab Results  Component Value Date   CHOL 157 06/28/2022   HDL 46.70 06/28/2022   LDLCALC 88 06/28/2022   TRIG 109.0 06/28/2022   CHOLHDL 3 06/28/2022   Last hemoglobin A1c Lab Results  Component Value Date   HGBA1C 5.4 10/04/2022   HGBA1C 5.4 10/04/2022   HGBA1C 5.4 (A) 10/04/2022   HGBA1C 5.4 10/04/2022   Last thyroid functions Lab Results  Component Value Date   TSH 2.10 03/01/2021   IMPRESSION AND PLAN:  #1 chronic pain syndrome. Chronic low back pain due to degenerative disc disease, lumbar spinal stenosis with neurogenic claudication. She has a recent fall onto  her back.  We will check thoracic and lumbar spine plain films today. Additionally, she has evidence of osteoarthritis of shoulders and hands on exam.  We will check shoulder films today. Continue Vicodin 5-3 25 1  twice daily as needed. Controlled substance contract up-to-date, urine drug screen today.  #2 hypertension, well controlled on amlodipine 10 mg a day and Lopressor 25 mg twice daily.  3. chronic anxiety.  Stable on alprazolam 1 mg 3 times daily long-term. Urine drug screen today.  #4 prediabetes. Point-of-care hemoglobin A1c is 5.4% today.  #5 chronic renal insufficiency stage III. Avoid NSAIDs.  Hydrate. Electrolytes and creatinine today.  An After Visit Summary was printed and given to the patient.  FOLLOW UP: Return in about 3 months (around 01/04/2023) for routine chronic illness f/u.  Signed:  Santiago Bumpers, MD           10/04/2022

## 2022-10-05 ENCOUNTER — Ambulatory Visit (INDEPENDENT_AMBULATORY_CARE_PROVIDER_SITE_OTHER): Payer: Medicare Other

## 2022-10-05 DIAGNOSIS — M25512 Pain in left shoulder: Secondary | ICD-10-CM

## 2022-10-05 DIAGNOSIS — M25511 Pain in right shoulder: Secondary | ICD-10-CM | POA: Diagnosis not present

## 2022-10-05 DIAGNOSIS — M549 Dorsalgia, unspecified: Secondary | ICD-10-CM

## 2022-10-05 DIAGNOSIS — M545 Low back pain, unspecified: Secondary | ICD-10-CM | POA: Diagnosis not present

## 2022-10-05 DIAGNOSIS — G8929 Other chronic pain: Secondary | ICD-10-CM

## 2022-10-05 DIAGNOSIS — M4126 Other idiopathic scoliosis, lumbar region: Secondary | ICD-10-CM | POA: Diagnosis not present

## 2022-10-05 DIAGNOSIS — M19011 Primary osteoarthritis, right shoulder: Secondary | ICD-10-CM | POA: Diagnosis not present

## 2022-10-07 LAB — DRUG MONITORING PANEL 376104, URINE
Alphahydroxyalprazolam: 953 ng/mL — ABNORMAL HIGH (ref <25)
Alphahydroxymidazolam: NEGATIVE ng/mL (ref <50)
Alphahydroxytriazolam: NEGATIVE ng/mL (ref <50)
Aminoclonazepam: NEGATIVE ng/mL (ref <25)
Amphetamines: NEGATIVE ng/mL (ref <500)
Barbiturates: NEGATIVE ng/mL (ref <300)
Benzodiazepines: POSITIVE ng/mL — AB (ref <100)
Cocaine Metabolite: NEGATIVE ng/mL (ref <150)
Codeine: NEGATIVE ng/mL (ref <50)
Desmethyltramadol: NEGATIVE ng/mL (ref <100)
Hydrocodone: 2861 ng/mL — ABNORMAL HIGH (ref <50)
Hydromorphone: 416 ng/mL — ABNORMAL HIGH (ref <50)
Hydroxyethylflurazepam: NEGATIVE ng/mL (ref <50)
Lorazepam: NEGATIVE ng/mL (ref <50)
Morphine: NEGATIVE ng/mL (ref <50)
Nordiazepam: NEGATIVE ng/mL (ref <50)
Norhydrocodone: 1512 ng/mL — ABNORMAL HIGH (ref <50)
Opiates: POSITIVE ng/mL — AB (ref <100)
Oxazepam: NEGATIVE ng/mL (ref <50)
Oxycodone: NEGATIVE ng/mL (ref <100)
Temazepam: NEGATIVE ng/mL (ref <50)
Tramadol: NEGATIVE ng/mL (ref <100)

## 2022-10-07 LAB — DM TEMPLATE

## 2022-10-09 ENCOUNTER — Encounter: Payer: Self-pay | Admitting: Family Medicine

## 2022-10-27 ENCOUNTER — Other Ambulatory Visit: Payer: Self-pay | Admitting: Podiatry

## 2022-10-27 ENCOUNTER — Encounter: Payer: Self-pay | Admitting: Podiatry

## 2022-10-27 DIAGNOSIS — M21611 Bunion of right foot: Secondary | ICD-10-CM | POA: Diagnosis not present

## 2022-10-27 DIAGNOSIS — M2041 Other hammer toe(s) (acquired), right foot: Secondary | ICD-10-CM | POA: Diagnosis not present

## 2022-10-27 DIAGNOSIS — M2011 Hallux valgus (acquired), right foot: Secondary | ICD-10-CM | POA: Diagnosis not present

## 2022-10-27 DIAGNOSIS — M89771 Major osseous defect, right ankle and foot: Secondary | ICD-10-CM | POA: Diagnosis not present

## 2022-10-27 DIAGNOSIS — G8918 Other acute postprocedural pain: Secondary | ICD-10-CM | POA: Diagnosis not present

## 2022-10-27 MED ORDER — HYDROCODONE-ACETAMINOPHEN 5-325 MG PO TABS: 1.0000 | ORAL_TABLET | Freq: Four times a day (QID) | ORAL | 0 refills | Status: AC | PRN

## 2022-11-02 ENCOUNTER — Ambulatory Visit (INDEPENDENT_AMBULATORY_CARE_PROVIDER_SITE_OTHER): Payer: Medicare Other

## 2022-11-02 ENCOUNTER — Ambulatory Visit (INDEPENDENT_AMBULATORY_CARE_PROVIDER_SITE_OTHER): Payer: Medicare Other | Admitting: Podiatry

## 2022-11-02 DIAGNOSIS — M2011 Hallux valgus (acquired), right foot: Secondary | ICD-10-CM | POA: Diagnosis not present

## 2022-11-02 DIAGNOSIS — M2041 Other hammer toe(s) (acquired), right foot: Secondary | ICD-10-CM

## 2022-11-02 DIAGNOSIS — M21611 Bunion of right foot: Secondary | ICD-10-CM

## 2022-11-02 DIAGNOSIS — M21961 Unspecified acquired deformity of right lower leg: Secondary | ICD-10-CM

## 2022-11-02 MED ORDER — FLUCONAZOLE 150 MG PO TABS: 150.0000 mg | ORAL_TABLET | ORAL | 0 refills | Status: AC

## 2022-11-02 MED ORDER — CEPHALEXIN 500 MG PO CAPS: 500.0000 mg | ORAL_CAPSULE | Freq: Four times a day (QID) | ORAL | 0 refills | Status: DC

## 2022-11-03 ENCOUNTER — Other Ambulatory Visit: Payer: Self-pay

## 2022-11-03 MED ORDER — HYDROCODONE-ACETAMINOPHEN 5-325 MG PO TABS: ORAL_TABLET | ORAL | 0 refills | Status: DC

## 2022-11-03 NOTE — Telephone Encounter (Signed)
Requesting: norco  Contract: 111/29/23 UDS: 10/04/22 Last Visit: 10/04/22 Next Visit: 01/04/23 Last Refill: 10/02/22 (60,0)  Please Advise. Med pending

## 2022-11-03 NOTE — Telephone Encounter (Signed)
Patient refill request. [facility]  Hydrocodone 5-325mg 

## 2022-11-05 ENCOUNTER — Encounter: Payer: Self-pay | Admitting: Podiatry

## 2022-11-05 NOTE — Progress Notes (Signed)
  Subjective:  Patient ID: Sheila Newman, female    DOB: 10-07-1946,  MRN: [id number]  Chief Complaint  Patient presents with   Routine Post Op    POV #1 DOS 10/27/2022 BUNION CORRECTION RIGHT W/GREAT TOE FUSION, HAMMERTOE CORRECTION & METATARSAL SHORTENING 2ND, BONE GRAFTFROM HEEL     76 y.o. female returns for post-op check.  Doing okay pain is improving  Review of Systems: Negative except as noted in the HPI. Denies N/V/F/Ch.   Objective:  There were no vitals filed for this visit. There is no height or weight on file to calculate BMI. Constitutional Well developed. Well nourished.  Vascular Foot warm and well perfused. Capillary refill normal to all digits.  Calf is soft and supple, no posterior calf or knee pain, negative Homans' sign  Neurologic Normal speech. Oriented to person, place, and time. Epicritic sensation to light touch grossly present bilaterally.  Dermatologic Skin healing with edges well coapted she does have some peri-incisional erythema no dehiscence or cellulitis or purulence  Orthopedic: Tenderness to palpation noted about the surgical site.   Multiple view plain film radiographs: Good correction of hallux valgus deformity with hardware intact and in position no complication noted Assessment:   1. Hallux valgus with bunions, right   2. Hammertoe of right foot   3. Deformity of metatarsal bone of right foot    Plan:  Patient was evaluated and treated and all questions answered.  S/p foot surgery right -Progressing as expected post-operatively. -XR: Noted above -WB Status: NWB in cam walker boot, can begin weightbearing in 10 days -Sutures: Plan to remove in 2 weeks. -Medications: Rx for cephalexin sent to pharmacy as a precaution, may have superficial incisional infection developing, Diflucan sent as precaution for common yeast infections which she has had before -Foot redressed.  Return in 1 week for check of erythema and incision, plan to remove  sutures in 2 weeks if not ready at that point  Return in about 1 week (around 11/09/2022) for incision check.

## 2022-11-10 ENCOUNTER — Ambulatory Visit (INDEPENDENT_AMBULATORY_CARE_PROVIDER_SITE_OTHER): Payer: Medicare Other | Admitting: *Deleted

## 2022-11-10 DIAGNOSIS — Z9889 Other specified postprocedural states: Secondary | ICD-10-CM

## 2022-11-10 DIAGNOSIS — M2041 Other hammer toe(s) (acquired), right foot: Secondary | ICD-10-CM

## 2022-11-10 DIAGNOSIS — M21611 Bunion of right foot: Secondary | ICD-10-CM

## 2022-11-10 DIAGNOSIS — M2011 Hallux valgus (acquired), right foot: Secondary | ICD-10-CM

## 2022-11-10 DIAGNOSIS — M21961 Unspecified acquired deformity of right lower leg: Secondary | ICD-10-CM

## 2022-11-10 NOTE — Progress Notes (Signed)
Patient presents today for post op visit # 2, patient of [facility].   POV #2 DOS 10/27/2022 BUNION CORRECTION RIGHT W/GREAT TOE FUSION, HAMMERTOE CORRECTION & METATARSAL SHORTENING 2ND, BONE GRAFTFROM HEE    Patient presents in the walking boot weight bearing. Denies any falls or injury to the foot. Foot is swollen and tender. No signs of infection. No calf pain or shortness of breath. Bandages dry and intact. Incision is intact.       Foot redressed today and placed back in the boot. Reviewed icing and elevation. Patient will follow up with Dr. Sherryle Lis next for POV# 3. She does have an appointment already scheduled for 11/16/2022.

## 2022-11-16 ENCOUNTER — Ambulatory Visit (INDEPENDENT_AMBULATORY_CARE_PROVIDER_SITE_OTHER): Payer: Medicare Other | Admitting: Podiatry

## 2022-11-16 ENCOUNTER — Encounter: Payer: Medicare Other | Admitting: Podiatry

## 2022-11-16 DIAGNOSIS — M21611 Bunion of right foot: Secondary | ICD-10-CM

## 2022-11-16 DIAGNOSIS — M2011 Hallux valgus (acquired), right foot: Secondary | ICD-10-CM

## 2022-11-16 DIAGNOSIS — M2041 Other hammer toe(s) (acquired), right foot: Secondary | ICD-10-CM

## 2022-11-20 ENCOUNTER — Encounter: Payer: Self-pay | Admitting: Podiatry

## 2022-11-20 NOTE — Progress Notes (Signed)
  Subjective:  Patient ID: Stephania Fragmin, female    DOB: 12-Jul-1946,  MRN: [id number]  Chief Complaint  Patient presents with   Routine Post Op    POV #2 DOS 10/27/2022 BUNION CORRECTION W/GREAT TOE FUSION, HAMMERTOE CORRECTION & METATARSAL SHORTENING 2ND, BONE GRAFTFROM HEEL     77 y.o. female returns for post-op check.  Doing much better now  Review of Systems: Negative except as noted in the HPI. Denies N/V/F/Ch.   Objective:  There were no vitals filed for this visit. There is no height or weight on file to calculate BMI. Constitutional Well developed. Well nourished.  Vascular Foot warm and well perfused. Capillary refill normal to all digits.  Calf is soft and supple, no posterior calf or knee pain, negative Homans' sign  Neurologic Normal speech. Oriented to person, place, and time. Epicritic sensation to light touch grossly present bilaterally.  Dermatologic Incision well-healed not hypertrophic no signs of infection  Orthopedic: Tenderness to palpation noted about the surgical site.   Multiple view plain film radiographs: Good correction of hallux valgus deformity with hardware intact and in position no complication noted Assessment:   1. Hallux valgus with bunions, right   2. Hammertoe of right foot    Plan:  Patient was evaluated and treated and all questions answered.  S/p foot surgery right -Sutures removed today, may begin bathing, can begin to partial weight-bear in the cam walker boot.  Return in 3 weeks for new radiographs  No follow-ups on file.

## 2022-11-21 ENCOUNTER — Encounter: Payer: Self-pay | Admitting: Family Medicine

## 2022-11-21 ENCOUNTER — Other Ambulatory Visit: Payer: Self-pay | Admitting: Family Medicine

## 2022-11-21 ENCOUNTER — Other Ambulatory Visit: Payer: Self-pay | Admitting: Podiatry

## 2022-11-27 ENCOUNTER — Telehealth: Payer: Self-pay

## 2022-11-27 NOTE — Telephone Encounter (Signed)
She is taking the Hydrocodone 5-325mg 

## 2022-11-27 NOTE — Telephone Encounter (Signed)
Left vm for the patient to return my call.

## 2022-12-04 ENCOUNTER — Other Ambulatory Visit: Payer: Self-pay | Admitting: Family Medicine

## 2022-12-04 MED ORDER — HYDROCODONE-ACETAMINOPHEN 5-325 MG PO TABS: ORAL_TABLET | ORAL | 0 refills | Status: DC

## 2022-12-04 NOTE — Telephone Encounter (Signed)
Pt advised refill sent. °

## 2022-12-04 NOTE — Telephone Encounter (Signed)
Requesting: Norco Contract: 10/04/22 UDS: 10/04/22 Last Visit: 10/04/22 Next Visit: 01/04/23 Last Refill: 11/03/22 (60,0)  Please Advise. Med pending

## 2022-12-04 NOTE — Telephone Encounter (Signed)
   Prescription Request  12/04/2022  Is this a "Controlled Substance" medicine? Yes  LOV: 10/04/2022  What is the name of the medication or equipment? HYDROcodone-acetaminophen (NORCO/VICODIN) 5-325 MG tablet   Have you contacted your pharmacy to request a refill? No   Which pharmacy would you like this sent to?  [id number] [place], [place] - [address] [place] [address] Phone: [phone] Fax: [phone]    Patient notified that their request is being sent to the clinical staff for review and that they should receive a response within 2 business days.

## 2022-12-05 ENCOUNTER — Ambulatory Visit (INDEPENDENT_AMBULATORY_CARE_PROVIDER_SITE_OTHER): Payer: Medicare Other | Admitting: Podiatry

## 2022-12-05 ENCOUNTER — Ambulatory Visit (INDEPENDENT_AMBULATORY_CARE_PROVIDER_SITE_OTHER): Payer: Medicare Other

## 2022-12-05 DIAGNOSIS — M2011 Hallux valgus (acquired), right foot: Secondary | ICD-10-CM

## 2022-12-05 DIAGNOSIS — M21611 Bunion of right foot: Secondary | ICD-10-CM | POA: Diagnosis not present

## 2022-12-05 DIAGNOSIS — M2041 Other hammer toe(s) (acquired), right foot: Secondary | ICD-10-CM

## 2022-12-05 NOTE — Patient Instructions (Signed)
Wear the boot for 2 more weeks. Then wear the surgical plan shoe for 2 weeks, then back to your [place] shoes

## 2022-12-11 ENCOUNTER — Encounter: Payer: Self-pay | Admitting: Podiatry

## 2022-12-11 NOTE — Progress Notes (Signed)
  Subjective:  Patient ID: Stephania Fragmin, female    DOB: [birth year],  MRN: [id number]  Chief Complaint  Patient presents with   Routine Post Op    POV #3 DOS 10/27/2022 BUNION CORRECTION W/GREAT TOE FUSION, HAMMERTOE CORRECTION & METATARSAL SHORTENING 2ND, BONE GRAFTFROM HEEL     77 y.o. female returns for post-op check.  Overall she is doing okay still having quite a bit of swelling but pain is much better  Review of Systems: Negative except as noted in the HPI. Denies N/V/F/Ch.   Objective:  There were no vitals filed for this visit. There is no height or weight on file to calculate BMI. Constitutional Well developed. Well nourished.  Vascular Foot warm and well perfused. Capillary refill normal to all digits.  Calf is soft and supple, no posterior calf or knee pain, negative Homans' sign  Neurologic Normal speech. Oriented to person, place, and time. Epicritic sensation to light touch grossly present bilaterally.  Dermatologic Incision well-healed not hypertrophic no signs of infection  Orthopedic: She has moderate edema and little tenderness to palpation noted about the surgical site.   Multiple view plain film radiographs: Correction is maintained with hardware intact, good consolidation across fusion sites Assessment:   1. Hallux valgus with bunions, right   2. Hammertoe of right foot    Plan:  Patient was evaluated and treated and all questions answered.  S/p foot surgery right -She is doing very well she will continue weightbearing as tolerated for the next 2 weeks then she will wear a surgical shoe which was dispensed today for an additional 2 weeks.  After this she will return to regular supportive shoe gear if she tolerates it such as [place] if her edema has reduced.  I will see her back in 6 weeks for new radiographs expect full return to activity and she was then  Return in about 6 weeks (around 01/16/2023) for post op (new x-rays).

## 2023-01-04 ENCOUNTER — Encounter: Payer: Self-pay | Admitting: Family Medicine

## 2023-01-04 ENCOUNTER — Ambulatory Visit (INDEPENDENT_AMBULATORY_CARE_PROVIDER_SITE_OTHER): Payer: Medicare Other | Admitting: Family Medicine

## 2023-01-04 ENCOUNTER — Telehealth: Payer: Self-pay

## 2023-01-04 VITALS — BP 104/63 | HR 66 | Temp 98.1°F | Ht 60.5 in | Wt 147.2 lb

## 2023-01-04 DIAGNOSIS — I10 Essential (primary) hypertension: Secondary | ICD-10-CM

## 2023-01-04 DIAGNOSIS — Z23 Encounter for immunization: Secondary | ICD-10-CM

## 2023-01-04 DIAGNOSIS — Z1231 Encounter for screening mammogram for malignant neoplasm of breast: Secondary | ICD-10-CM | POA: Diagnosis not present

## 2023-01-04 DIAGNOSIS — E348 Other specified endocrine disorders: Secondary | ICD-10-CM | POA: Diagnosis not present

## 2023-01-04 DIAGNOSIS — M25511 Pain in right shoulder: Secondary | ICD-10-CM

## 2023-01-04 DIAGNOSIS — M48062 Spinal stenosis, lumbar region with neurogenic claudication: Secondary | ICD-10-CM

## 2023-01-04 DIAGNOSIS — M545 Low back pain, unspecified: Secondary | ICD-10-CM | POA: Diagnosis not present

## 2023-01-04 DIAGNOSIS — M25512 Pain in left shoulder: Secondary | ICD-10-CM

## 2023-01-04 DIAGNOSIS — M19012 Primary osteoarthritis, left shoulder: Secondary | ICD-10-CM

## 2023-01-04 DIAGNOSIS — G8929 Other chronic pain: Secondary | ICD-10-CM

## 2023-01-04 DIAGNOSIS — M19011 Primary osteoarthritis, right shoulder: Secondary | ICD-10-CM

## 2023-01-04 MED ORDER — AMLODIPINE BESYLATE 10 MG PO TABS: 10.0000 mg | ORAL_TABLET | Freq: Every day | ORAL | 1 refills | Status: DC

## 2023-01-04 MED ORDER — ALPRAZOLAM 1 MG PO TABS: 1.0000 mg | ORAL_TABLET | Freq: Three times a day (TID) | ORAL | 5 refills | Status: DC

## 2023-01-04 MED ORDER — METOPROLOL TARTRATE 25 MG PO TABS: 25.0000 mg | ORAL_TABLET | Freq: Two times a day (BID) | ORAL | 1 refills | Status: DC

## 2023-01-04 MED ORDER — ATORVASTATIN CALCIUM 20 MG PO TABS: 20.0000 mg | ORAL_TABLET | Freq: Every day | ORAL | 1 refills | Status: DC

## 2023-01-04 MED ORDER — METHYLPREDNISOLONE ACETATE 40 MG/ML IJ SUSP
40.0000 mg | Freq: Once | INTRAMUSCULAR | Status: AC
  Administered 2023-01-04 (×2): 40 mg via INTRA_ARTICULAR

## 2023-01-04 MED ORDER — PANTOPRAZOLE SODIUM 40 MG PO TBEC: 40.0000 mg | DELAYED_RELEASE_TABLET | Freq: Every day | ORAL | 1 refills | Status: DC

## 2023-01-04 MED ORDER — ZOSTER VAC RECOMB ADJUVANTED 50 MCG/0.5ML IM SUSR: 0.5000 mL | Freq: Once | INTRAMUSCULAR | 0 refills | Status: AC

## 2023-01-04 MED ORDER — TRAZODONE HCL 50 MG PO TABS: 50.0000 mg | ORAL_TABLET | Freq: Every day | ORAL | 1 refills | Status: DC

## 2023-01-04 MED ORDER — HYDROCODONE-ACETAMINOPHEN 5-325 MG PO TABS: ORAL_TABLET | ORAL | 0 refills | Status: DC

## 2023-01-04 NOTE — Progress Notes (Signed)
OFFICE VISIT  01/04/2023  CC:  Chief Complaint  Patient presents with   Medical Management of Chronic Issues    Patient is a 77 y.o. female who presents for 56-monthfollow-up chronic pain syndrome, hypertension, chronic renal insufficiency stage III. A/P as of last visit: "#1 chronic pain syndrome. Chronic low back pain due to degenerative disc disease, lumbar spinal stenosis with neurogenic claudication. She has a recent fall onto her back.  We will check thoracic and lumbar spine plain films today. Additionally, she has evidence of osteoarthritis of shoulders and hands on exam.  We will check shoulder films today. Continue Vicodin 5-'3 25 1 '$ twice daily as needed. Controlled substance contract up-to-date, urine drug screen today.   #2 hypertension, well controlled on amlodipine 10 mg a day and Lopressor 25 mg twice daily.   3. chronic anxiety.  Stable on alprazolam 1 mg 3 times daily long-term. Urine drug screen today.   #4 prediabetes. Point-of-care hemoglobin A1c is 5.4% today.   #5 chronic renal insufficiency stage III. Avoid NSAIDs.  Hydrate. Electrolytes and creatinine today."  INTERIM HX:  Sheila Tylickistates that she has had gradually worsening bilateral shoulder pain, right significantly worse than left.  Hurts constantly, makes it hard to initiate sleep at night because she cannot lay on her shoulders.  Feels like a deep ache, range of motion makes it worse as well.  This has been building up for months.  She has some chronic neck pain and stiffness and some intermittent radiation of the pain down the arm to about the elbow.  No paresthesias.  No arm weakness. No injuries to the shoulders.  She had foot surgery at the podiatrist about 2 months ago and says she is gradually recovering.  Control of her chronic pain is stable on 1 hydrocodone tab twice a day. Indication for chronic opioid: chronic neck, mid back and low back pain, DDD, neurogenic claudication/spinal stenosis  (R>L).  Not NSAID candidate d/t CRI. Medication and dose: vicodin 5/325, 1 bid # pills per month: 60 PMP AWARE reviewed today: most recent rx for alprazolam was filled 12/23/2022, # 956 rx by me.  Most recent prescription for Vicodin filled 12/04/2022, #60, prescription by me. No red flags.  Past Medical History:  Diagnosis Date   Anxiety and depression    Bilateral edema of lower extremity    Chronic pain syndrome    Dr. OFrancesco Runner  Chronic renal insufficiency, stage 3 (moderate) (HCC)    GFR 40s.  Renal u/s normal 06/2022   Frequent headaches    GERD (gastroesophageal reflux disease)    History of sepsis    UTI/admission   Hypercholesterolemia    Hypertension    Insomnia    Iron deficiency anemia    ?etiology.   Osteoarthritis, multiple sites    Feet, shoulders, back    Past Surgical History:  Procedure Laterality Date   APPENDECTOMY  1986   Bunion and hammertoe surgery, right     10/27/2022 BUNION CORRECTION W/GREAT TOE FUSION, HAMMERTOE CORRECTION & METATARSAL SHORTENING 2ND, BONE GRAFTFROM HEEL   COLONOSCOPY  2018   normal (dig [facility] spec per pt report but that office has no records of her seeing them)   ESOPHAGOGASTRODUODENOSCOPY     neg for bleeding ?eval    Outpatient Medications Prior to Visit  Medication Sig Dispense Refill   Ascorbic Acid (VITAMIN C PO) Take by mouth daily.     aspirin 81 MG chewable tablet Chew 81 mg by mouth daily  in the afternoon.     Cholecalciferol (VITAMIN D3 PO) Take 50 mg by mouth daily.     Cyanocobalamin (VITAMIN B-12 PO) Take by mouth daily.     fluticasone (FLONASE) 50 MCG/ACT nasal spray Place 1 spray into both nostrils daily. 48 g 3   gabapentin (NEURONTIN) 100 MG capsule Take 1 capsule (100 mg total) by mouth in the morning, at noon, in the evening, and at bedtime. 360 capsule 1   Iron (IRCON PO) Take 65 mg by mouth daily.     Omega-3 Fatty Acids (FISH OIL PO) Take by mouth daily.     VITAMIN E PO Take by mouth daily.     ALPRAZolam  (XANAX) 1 MG tablet TAKE 1 TABLET BY MOUTH 3 TIMES DAILY. 90 tablet 5   amLODipine (NORVASC) 10 MG tablet TAKE 1 TABLET BY MOUTH EVERY DAY 90 tablet 0   atorvastatin (LIPITOR) 20 MG tablet TAKE 1 TABLET BY MOUTH EVERY DAY 90 tablet 0   cephALEXin (KEFLEX) 500 MG capsule Take 1 capsule (500 mg total) by mouth 4 (four) times daily. (Patient not taking: Reported on 01/04/2023) 30 capsule 0   HYDROcodone-acetaminophen (NORCO/VICODIN) 5-325 MG tablet 1 tab po bid prn pain 60 tablet 0   metoprolol tartrate (LOPRESSOR) 25 MG tablet TAKE 1 TABLET BY MOUTH TWICE A DAY 180 tablet 0   pantoprazole (PROTONIX) 40 MG tablet Take 1 tablet (40 mg total) by mouth daily. Take 1 tablet by mouth daily. 90 tablet 1   traZODone (DESYREL) 50 MG tablet TAKE 1 TABLET BY MOUTH DAILY 90 tablet 0   No facility-administered medications prior to visit.    Allergies  Allergen Reactions   Tramadol Itching    Review of Systems As per HPI  PE:    01/04/2023    1:47 PM 10/04/2022   10:46 AM 06/28/2022   10:24 AM  Vitals with BMI  Height 5' 0.5" 5' 0.5" 5' 0.5"  Weight 147 lbs 3 oz 146 lbs 10 oz 147 lbs 6 oz  BMI 28.26 [id number]  Systolic 123456 123456 [id number]  Diastolic 63 74 67  Pulse 66 65 58     Physical Exam  Gen: Alert, well appearing.  Patient is oriented to person, place, time, and situation. AFFECT: pleasant, lucid thought and speech. Shoulders: Right shoulder generalized tenderness to palpation without erythema or swelling. Abduction emitted to 90 degrees, positive O'Brien's, positive Hawkins, positive Neer's. Active and passive shoulder external and internal rotation painful. Sensation intact, strength 5 out of 5 in proximal and distal muscles of the arm bilaterally.  CV: RRR, no m/r/g.   LUNGS: CTA bilat, nonlabored resps, good aeration in all lung fields. EXT: R LL 1-2 + pitting edema in ankle and foot, no edema on L LL or foot.  LABS:  Last CBC Lab Results  Component Value Date   WBC 6.2  10/19/2021   HGB 11.8 (L) 10/19/2021   HCT 36.1 10/19/2021   MCV 88.1 10/19/2021   RDW 13.7 10/19/2021   PLT 248.0 [id number]   Last metabolic panel Lab Results  Component Value Date   GLUCOSE 83 10/04/2022   NA 140 10/04/2022   K 4.0 10/04/2022   CL 106 10/04/2022   CO2 29 10/04/2022   BUN 14 10/04/2022   CREATININE 1.04 10/04/2022   GFRNONAA 41 03/01/2021   CALCIUM 8.7 10/04/2022   PROT 5.9 (L) 06/28/2022   ALBUMIN 3.7 06/28/2022   BILITOT 0.6 06/28/2022   ALKPHOS 54 06/28/2022  AST 19 06/28/2022   ALT 13 06/28/2022   Last lipids Lab Results  Component Value Date   CHOL 157 06/28/2022   HDL 46.70 06/28/2022   LDLCALC 88 06/28/2022   TRIG 109.0 06/28/2022   CHOLHDL 3 06/28/2022   Last hemoglobin A1c Lab Results  Component Value Date   HGBA1C 5.4 10/04/2022   HGBA1C 5.4 10/04/2022   HGBA1C 5.4 (A) 10/04/2022   HGBA1C 5.4 10/04/2022   Last thyroid functions Lab Results  Component Value Date   TSH 2.10 03/01/2021   IMPRESSION AND PLAN:  #1 bilateral shoulder pain, right greater than left. X-rays in the recent past consistent with osteoarthritis. Exam is somewhat nonfocal today--suspect she has a combination of before meals and glenohumeral arthritis contributing, possibly even some subacromial impingement contributing. She is requesting injection into the shoulder today, preferably both.  She does not recall having any injections in the shoulder in the past. We decided to do trial of glenohumeral injections bilaterally. Ultrasound-guided injection is preferred based on studies that show increased duration, increased effect, greater accuracy, decreased procedural pain, increased response rate, and decreased cost with ultrasound-guided versus blind injection. Procedure: Real-time ultrasound guided injection of glenohumeral joint bilaterally. Device: [employer] informed consent obtained.  Timeout conducted.  No overlying erythema, induration, or other  signs of local infection. After sterile prep with Betadine, injected 2 cc of 2% lidocaine without epinephrine followed by a mixture of 3 cc of 2% lidocaine and 40 mg Depo-Medrol.  Injectate seen distending/filling capsule. Patient tolerated the procedure well.  No immediate complications.  Post-injection care discussed. Advised to call if fever/chills, erythema, drainage, or persistent bleeding. Impression: Technically successful ultrasound-guided injection.  About 5 minutes after the injection patient got off of the table and sat in chair in the room.  She stated she felt dizzy.  Heart rate, blood pressure, and oxygen saturation remained normal.  She was observed by myself as well as my nurse for about 15 minutes.  She was able to leave without problem after resolution of her dizziness.  #2 chronic pain syndrome, fairly well-controlled on the chronic regimen of Vicodin 5-3 25, 1 twice daily.  #60 prescribed today.  3.  Hypertension, well-controlled on amlodipine 10 mg daily.  4 chronic renal insufficiency stage III. Since she was dizzy not long after her injections today we decided to forego any lab work. Electrolytes and creatinine were stable at last check 3 months ago. She avoids NSAIDs.  An After Visit Summary was printed and given to the patient.  FOLLOW UP: Return in about 3 months (around 04/04/2023) for routine chronic illness f/u.  Signed:  Crissie Sickles, MD           01/04/2023

## 2023-01-04 NOTE — Patient Instructions (Addendum)
Please contact [phone], select option 1 to schedule for the mammogram bus at our office on 02/16/23.

## 2023-01-04 NOTE — Telephone Encounter (Signed)
[  4:28 PM] Sheila Newman Pls call [name] and see how she's feeling  Pt confirmed she made it home. She is still feeling dizzy but not as much as earlier. Declined needing anything else from our office at this time.

## 2023-01-04 NOTE — Telephone Encounter (Signed)
noted 

## 2023-01-08 NOTE — Addendum Note (Signed)
Addended by: Beatrix Fetters on: 01/08/2023 09:41 AM   Modules accepted: Orders

## 2023-01-16 ENCOUNTER — Ambulatory Visit (INDEPENDENT_AMBULATORY_CARE_PROVIDER_SITE_OTHER): Payer: Medicare Other

## 2023-01-16 ENCOUNTER — Ambulatory Visit (INDEPENDENT_AMBULATORY_CARE_PROVIDER_SITE_OTHER): Payer: Medicare Other | Admitting: Podiatry

## 2023-01-16 DIAGNOSIS — M21611 Bunion of right foot: Secondary | ICD-10-CM

## 2023-01-16 DIAGNOSIS — S93331A Other subluxation of right foot, initial encounter: Secondary | ICD-10-CM | POA: Diagnosis not present

## 2023-01-16 DIAGNOSIS — M2041 Other hammer toe(s) (acquired), right foot: Secondary | ICD-10-CM | POA: Diagnosis not present

## 2023-01-16 DIAGNOSIS — M2011 Hallux valgus (acquired), right foot: Secondary | ICD-10-CM | POA: Diagnosis not present

## 2023-01-16 NOTE — Patient Instructions (Signed)

## 2023-01-17 NOTE — Progress Notes (Signed)
  Subjective:  Patient ID: Sheila Newman, female    DOB: [birth year],  MRN: [id number]  Chief Complaint  Patient presents with   Routine Post Op    POV #4 DOS 10/27/2022 BUNION CORRECTION W/GREAT TOE FUSION, HAMMERTOE CORRECTION & METATARSAL SHORTENING 2ND, BONE GRAFTFROM HEEL     77 y.o. female returns for post-op check.  She continues to improve she does still have occasional tenderness and edema but is minimal she is back to regular shoe gear and full ambulation without assistive aids.  She has a friend that is a therapist and they have been working on range of motion of the toes.  She also notes a new issue of clicking on the outside of the ankle.  Review of Systems: Negative except as noted in the HPI. Denies N/V/F/Ch.   Objective:  There were no vitals filed for this visit. There is no height or weight on file to calculate BMI. Constitutional Well developed. Well nourished.  Vascular Foot warm and well perfused. Capillary refill normal to all digits.  Calf is soft and supple, no posterior calf or knee pain, negative Homans' sign  Neurologic Normal speech. Oriented to person, place, and time. Epicritic sensation to light touch grossly present bilaterally.  Dermatologic Incision well-healed not hypertrophic    Orthopedic: Minimal edema, no tenderness to palpation, she does have some limited range of motion of the second MTPJ.  Circumduction of the ankle results and peroneal subluxation, mild tenderness here   Multiple view plain film radiographs: Correction is maintained with hardware intact, complete consolidation across fusion sites Assessment:   1. Hallux valgus with bunions, right   2. Hammertoe of right foot   3. Subluxation of peroneal tendon of right foot    Plan:  Patient was evaluated and treated and all questions answered.  S/p foot surgery right -Doing very well still has some residual edema that may take some time to fully resolve and stiffness in the second  MTPJ which she is working on range of motion with a friend that is a physical therapist.  She may continue this and I expect gradually this will improve with time.  Her fusion site is well-healed and I have no physical restrictions for her.  She will return to see me as needed as it relates to her surgical correction  Regarding the clicking on the outside of the ankle she is having peroneal subluxation.  No known injury that she knows of.  Could be related to weakness in the ankle due to immobilization for a prolonged period.  I recommended physical therapy exercises, these were dispensed today and she will work on this as well with her physical therapist trainer and will let me know if it does not improve or worsens.  Return if symptoms worsen or fail to improve.

## 2023-01-20 ENCOUNTER — Other Ambulatory Visit: Payer: Self-pay | Admitting: Family Medicine

## 2023-01-31 ENCOUNTER — Telehealth: Payer: Self-pay | Admitting: Family Medicine

## 2023-01-31 ENCOUNTER — Ambulatory Visit (INDEPENDENT_AMBULATORY_CARE_PROVIDER_SITE_OTHER): Payer: Medicare Other

## 2023-01-31 ENCOUNTER — Encounter: Payer: Self-pay | Admitting: Family Medicine

## 2023-01-31 DIAGNOSIS — E348 Other specified endocrine disorders: Secondary | ICD-10-CM | POA: Diagnosis not present

## 2023-01-31 DIAGNOSIS — M8589 Other specified disorders of bone density and structure, multiple sites: Secondary | ICD-10-CM | POA: Diagnosis not present

## 2023-01-31 NOTE — Telephone Encounter (Signed)
Contacted Pricilla Holm Blaker to schedule their annual wellness visit. Appointment made for 02/07/2023.  [place] Direct Dial [phone]

## 2023-01-31 NOTE — Telephone Encounter (Signed)
Copied from [place] [id number]. Topic: Medicare AWV >> Jan 31, 2023  9:10 AM Gillis Santa wrote: Reason for CRM: Called patient to schedule Medicare Annual Wellness Visit (AWV). Left message for patient to call back and schedule Medicare Annual Wellness Visit (AWV). and   Last date of AWV: N/A  Please schedule an appointment at any time with Otila Kluver, [facility]. Please schedule AWVS with Otila Kluver, [place].  If any questions, please contact me at [phone].  Thank you ,  Shaune Pollack [facility] AWV TEAM Direct Dial [phone]

## 2023-02-05 ENCOUNTER — Other Ambulatory Visit: Payer: Self-pay | Admitting: Family Medicine

## 2023-02-05 MED ORDER — HYDROCODONE-ACETAMINOPHEN 5-325 MG PO TABS: ORAL_TABLET | ORAL | 0 refills | Status: DC

## 2023-02-05 NOTE — Telephone Encounter (Signed)
Requesting: [employer] Contract: 10/04/22 UDS: 10/04/22 Last Visit: 01/04/23 Next Visit: 04/04/23  Last Refill:  01/04/23 (60,0)  Please Advise. Med pending

## 2023-02-05 NOTE — Telephone Encounter (Signed)
Refill on HYDROcodone-acetaminophen (NORCO/VICODIN) 5-325 MG tablet  [facility] is the correct pharmacy

## 2023-02-07 ENCOUNTER — Ambulatory Visit (INDEPENDENT_AMBULATORY_CARE_PROVIDER_SITE_OTHER): Payer: Medicare Other

## 2023-02-07 VITALS — Wt 145.0 lb

## 2023-02-07 DIAGNOSIS — Z0000 Encounter for general adult medical examination without abnormal findings: Secondary | ICD-10-CM | POA: Diagnosis not present

## 2023-02-07 NOTE — Progress Notes (Signed)
I connected with  [name] on 02/07/23 by a audio enabled telemedicine application and verified that I am speaking with the correct person using two identifiers.  Patient Location: Home  Provider Location: Home Office  I discussed the limitations of evaluation and management by telemedicine. The patient expressed understanding and agreed to proceed.   Subjective:   Sheila Newman is a 77 y.o. female who presents for Medicare Annual (Subsequent) preventive examination.  Review of Systems     Cardiac Risk Factors include: advanced age (>61men, >92 women);dyslipidemia;hypertension     Objective:    Today's Vitals   02/07/23 0834  Weight: 145 lb (65.8 kg)   Body mass index is 27.85 kg/m.     02/07/2023    8:36 AM 02/01/2022   10:08 AM  Advanced Directives  Does Patient Have a Medical Advance Directive? Yes Yes  Type of Paramedic of [place];Living will [place] in Chart? No - copy requested No - copy requested    Current Medications (verified) Outpatient Encounter Medications as of 02/07/2023  Medication Sig   ALPRAZolam (XANAX) 1 MG tablet Take 1 tablet (1 mg total) by mouth 3 (three) times daily.   amLODipine (NORVASC) 10 MG tablet Take 1 tablet (10 mg total) by mouth daily.   Ascorbic Acid (VITAMIN C PO) Take by mouth daily.   aspirin 81 MG chewable tablet Chew 81 mg by mouth daily in the afternoon.   atorvastatin (LIPITOR) 20 MG tablet Take 1 tablet (20 mg total) by mouth daily.   Cholecalciferol (VITAMIN D3 PO) Take 50 mg by mouth daily.   Cyanocobalamin (VITAMIN B-12 PO) Take by mouth daily.   fluticasone (FLONASE) 50 MCG/ACT nasal spray SPRAY 1 SPRAY INTO BOTH NOSTRILS DAILY.   gabapentin (NEURONTIN) 100 MG capsule Take 1 capsule (100 mg total) by mouth in the morning, at noon, in the evening, and at bedtime.   HYDROcodone-acetaminophen (NORCO/VICODIN) 5-325 MG tablet 1 tab po bid  prn pain   Iron (IRCON PO) Take 65 mg by mouth daily.   metoprolol tartrate (LOPRESSOR) 25 MG tablet Take 1 tablet (25 mg total) by mouth 2 (two) times daily.   Omega-3 Fatty Acids (FISH OIL PO) Take by mouth daily.   pantoprazole (PROTONIX) 40 MG tablet Take 1 tablet (40 mg total) by mouth daily. Take 1 tablet by mouth daily.   traZODone (DESYREL) 50 MG tablet Take 1 tablet (50 mg total) by mouth daily.   VITAMIN E PO Take by mouth daily.   No facility-administered encounter medications on file as of 02/07/2023.    Allergies (verified) Tramadol   History: Past Medical History:  Diagnosis Date   Anxiety and depression    Bilateral edema of lower extremity    Chronic pain syndrome    Dr. Francesco Runner   Chronic renal insufficiency, stage 3 (moderate)    GFR 40s.  Renal u/s normal 06/2022   Frequent headaches    GERD (gastroesophageal reflux disease)    History of sepsis    UTI/admission   Hypercholesterolemia    Hypertension    Insomnia    Iron deficiency anemia    ?etiology.   Osteoarthritis, multiple sites    Feet, shoulders, back   Past Surgical History:  Procedure Laterality Date   APPENDECTOMY  1986   Bunion and hammertoe surgery, right     10/27/2022 BUNION CORRECTION W/GREAT TOE FUSION, HAMMERTOE CORRECTION & METATARSAL SHORTENING 2ND, BONE  GRAFTFROM HEEL   COLONOSCOPY  2018   normal (dig [facility] spec per pt report but that office has no records of her seeing them)   DEXA     01/2023 T score -2.3.  Rpt 2 yrs   ESOPHAGOGASTRODUODENOSCOPY     neg for bleeding ?eval   Family History  Problem Relation Age of Onset   Arthritis Mother    Diabetes Mother    Heart attack Father    Diabetes Father    Arthritis Sister    COPD Sister    Diabetes Brother    Diabetes Brother    Diabetes Brother    Diabetes Brother    Diabetes Brother    Diabetes Brother    Social History   Socioeconomic History   Marital status: Divorced    Spouse name: Not on file   Number of children:  Not on file   Years of education: Not on file   Highest education level: Not on file  Occupational History   Not on file  Tobacco Use   Smoking status: Never   Smokeless tobacco: Never  Substance and Sexual Activity   Alcohol use: Never   Drug use: Never   Sexual activity: Not on file  Other Topics Concern   Not on file  Social History Narrative   Widowed-->reMarried, 2 biologic, 2 adopted.   Educ: HS.  From [place]: [employer] x 40 yrs.  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   No T/A/Ds.   Social Determinants of Health   Financial Resource Strain: Low Risk  (02/07/2023)   Overall Financial Resource Strain (CARDIA)    Difficulty of Paying Living Expenses: Not hard at all  Food Insecurity: No Food Insecurity (02/07/2023)   Hunger Vital Sign    Worried About Running Out of Food in the Last Year: Never true    Ran Out of Food in the Last Year: Never true  Transportation Needs: No Transportation Needs (02/07/2023)   PRAPARE - Hydrologist (Medical): No    Lack of Transportation (Non-Medical): No  Physical Activity: Inactive (02/07/2023)   Exercise Vital Sign    Days of Exercise per Week: 0 days    Minutes of Exercise per Session: 0 min  Stress: Stress Concern Present (02/07/2023)   Teller    Feeling of Stress : To some extent  Social Connections: Moderately Isolated (02/07/2023)   Social Connection and Isolation Panel [NHANES]    Frequency of Communication with Friends and Family: Once a week    Frequency of Social Gatherings with Friends and Family: More than three times a week    Attends Religious Services: More than 4 times per year    Active Member of [employer] or Organizations: No    Attends Music therapist: Never    Marital Status: Divorced    Tobacco Counseling Counseling given: Not Answered   Clinical Intake:  Pre-visit preparation completed:  Yes  Pain : No/denies pain     BMI - recorded: 27.85 Nutritional Status: BMI 25 -29 Overweight Nutritional Risks: None Diabetes: No  How often do you need to have someone help you when you read instructions, pamphlets, or other written materials from your doctor or pharmacy?: 1 - Never  Diabetic?no  Interpreter Needed?: No  Information entered by :: Charlott Rakes, LPN   Activities of Daily Living    02/07/2023    8:39 AM  In  your present state of health, do you have any difficulty performing the following activities:  Hearing? 0  Vision? 0  Difficulty concentrating or making decisions? 0  Walking or climbing stairs? 0  Dressing or bathing? 0  Doing errands, shopping? 0  Preparing Food and eating ? N  Using the Toilet? N  In the past six months, have you accidently leaked urine? Y  Comment at times  Do you have problems with loss of bowel control? N  Managing your Medications? N  Managing your Finances? N  Housekeeping or managing your Housekeeping? N    Patient Care Team: Tammi Sou, MD as PCP - General (Family Medicine) Harlen Labs, MD as Referring Physician (Optometry) Mabe, Tim, DMD (Dentistry) Dorene Ar, MD Criselda Peaches, DPM as Consulting Physician (Podiatry)  Indicate any recent Medical Services you may have received from other than [facility] providers in the past year (date may be approximate).     Assessment:   This is a routine wellness examination for [place].  Hearing/Vision screen Hearing Screening - Comments:: Pt denies any hearing issues  Vision Screening - Comments:: Pt follows up with [facility] Dr Loa Socks for annual eye exam   Dietary issues and exercise activities discussed: Current Exercise Habits: The patient does not participate in regular exercise at present   Goals Addressed             This Visit's Progress    Patient Stated       Stay healthy       Depression Screen    02/07/2023    8:37 AM 01/04/2023    1:57 PM  06/28/2022   12:02 PM 03/22/2022   11:20 AM 02/01/2022   10:06 AM 09/28/2021    2:15 PM  PHQ 2/9 Scores  PHQ - 2 Score 0 4 1 2  0 0  PHQ- 9 Score  12        Fall Risk    02/07/2023    8:39 AM 10/04/2022   10:49 AM 06/28/2022   12:02 PM 06/28/2022   10:44 AM 03/22/2022   11:20 AM  Fall Risk   Falls in the past year? 0 1 1 1 1   Number falls in past yr: 0 1 1 1 1   Injury with Fall? 0 1 1 0 0  Risk for fall due to : Impaired vision Impaired vision Impaired vision Impaired vision Impaired vision;Impaired balance/gait  Follow up Falls prevention discussed Falls evaluation completed Falls evaluation completed Falls evaluation completed Falls evaluation completed    FALL RISK PREVENTION PERTAINING TO THE HOME:  Any stairs in or around the home? No  If so, are there any without handrails? No  Home free of loose throw rugs in walkways, pet beds, electrical cords, etc? Yes  Adequate lighting in your home to reduce risk of falls? Yes   ASSISTIVE DEVICES UTILIZED TO PREVENT FALLS:  Life alert? No  Use of a cane, walker or w/c? No  Grab bars in the bathroom? No  Shower chair or bench in shower? No  Elevated toilet seat or a handicapped toilet? No   TIMED UP AND GO:  Was the test performed? No .   Cognitive Function:        02/07/2023    8:40 AM 02/01/2022   10:10 AM  6CIT Screen  What Year? 0 points 0 points  What month? 0 points 0 points  What time? 0 points 0 points  Count back from 20 0 points 2  points  Months in reverse 0 points 0 points  Repeat phrase 0 points 4 points  Total Score 0 points 6 points    Immunizations Immunization History  Administered Date(s) Administered   COVID-19, mRNA, vaccine(Comirnaty)12 years and older 08/03/2022   Fluad Quad(high Dose 65+) 09/09/2018, 09/15/2020, 08/03/2022   Influenza Split 08/31/2015, 08/07/2016   Influenza, High Dose Seasonal PF 06/28/2019, 07/02/2019   Influenza, Seasonal, Injecte, Preservative Fre 10/11/2015    Influenza-Unspecified 09/03/2012, 08/19/2013, 09/02/2014, 09/04/2021   PFIZER(Purple Top)SARS-COV-2 Vaccination 01/15/2020, 02/05/2020, 10/08/2020   Pfizer Covid-19 Vaccine Bivalent Booster 65yrs & up 09/04/2021   Pneumococcal Conjugate-13 10/07/2014   Pneumococcal Polysaccharide-23 08/09/2010   Tdap 02/11/2015   Zoster Recombinat (Shingrix) 10/04/2017, 01/10/2023    TDAP status: Up to date  Flu Vaccine status: Up to date  Pneumococcal vaccine status: Up to date  Covid-19 vaccine status: Completed vaccines  Qualifies for Shingles Vaccine? Yes   Zostavax completed Yes   Shingrix Completed?: Yes  Screening Tests Health Maintenance  Topic Date Due   COVID-19 Vaccine (6 - 2023-24 season) 09/28/2022   Hepatitis C Screening  06/29/2023 (Originally 11/23/1963)   Pneumonia Vaccine 102+ Years old (3 of 3 - PPSV23 or PCV20) 01/04/2024 (Originally 10/08/2019)   INFLUENZA VACCINE  06/07/2023   Medicare Annual Wellness (AWV)  02/07/2024   DTaP/Tdap/Td (2 - Td or Tdap) 02/10/2025   COLONOSCOPY (Pts 45-22yrs Insurance coverage will need to be confirmed)  11/06/2026   DEXA SCAN  Completed   Zoster Vaccines- Shingrix  Completed   HPV VACCINES  Aged Out    Health Maintenance  Health Maintenance Due  Topic Date Due   COVID-19 Vaccine (6 - 2023-24 season) 09/28/2022    Colorectal cancer screening: Type of screening: Colonoscopy. Completed 11/06/16. Repeat every 10 years  Mammogram status: Ordered scheduled 02/16/23. Pt provided with contact info and advised to call to schedule appt.   Bone Density status: Completed 01/31/23. Results reflect: Bone density results: OSTEOPENIA. Repeat every 2 years.    Additional Screening:  Hepatitis C Screening: does qualify;   Vision Screening: Recommended annual ophthalmology exams for early detection of glaucoma and other disorders of the eye. Is the patient up to date with their annual eye exam?  Yes  Who is the provider or what is the name of the  office in which the patient attends annual eye exams? Dr Cletis Media If pt is not established with a provider, would they like to be referred to a provider to establish care? No .   Dental Screening: Recommended annual dental exams for proper oral hygiene  Community Resource Referral / Chronic Care Management: CRR required this visit?  No   CCM required this visit?  No      Plan:     I have personally reviewed and noted the following in the patient's chart:   Medical and social history Use of alcohol, tobacco or illicit drugs  Current medications and supplements including opioid prescriptions. Patient is currently taking opioid prescriptions. Information provided to patient regarding non-opioid alternatives. Patient advised to discuss non-opioid treatment plan with their provider. Functional ability and status Nutritional status Physical activity Advanced directives List of other physicians Hospitalizations, surgeries, and ER visits in previous 12 months Vitals Screenings to include cognitive, depression, and falls Referrals and appointments  In addition, I have reviewed and discussed with patient certain preventive protocols, quality metrics, and best practice recommendations. A written personalized care plan for preventive services as well as general preventive health recommendations were provided  to patient.     Willette Brace, LPN   [phone]   Nurse Notes: none

## 2023-02-07 NOTE — Patient Instructions (Signed)
Sheila Newman , Thank you for taking time to come for your Medicare Wellness Visit. I appreciate your ongoing commitment to your health goals. Please review the following plan we discussed and let me know if I can assist you in the future.   These are the goals we discussed:  Goals      Patient Stated     None at this time     Patient Stated     Stay healthy        This is a list of the screening recommended for you and due dates:  Health Maintenance  Topic Date Due   COVID-19 Vaccine (6 - 2023-24 season) 09/28/2022   Hepatitis C Screening: USPSTF Recommendation to screen - Ages 18-79 yo.  06/29/2023*   Pneumonia Vaccine (3 of 3 - PPSV23 or PCV20) 01/04/2024*   Flu Shot  06/07/2023   Medicare Annual Wellness Visit  02/07/2024   DTaP/Tdap/Td vaccine (2 - Td or Tdap) 02/10/2025   Colon Cancer Screening  11/06/2026   DEXA scan (bone density measurement)  Completed   Zoster (Shingles) Vaccine  Completed   HPV Vaccine  Aged Out  *Topic was postponed. The date shown is not the original due date.    Advanced directives: Please bring a copy of your health care power of attorney and living will to the office at your convenience.  Conditions/risks identified: stay healthy  Next appointment: Follow up in one year for your annual wellness visit    Preventive Care 65 Years and Older, Female Preventive care refers to lifestyle choices and visits with your health care provider that can promote health and wellness. What does preventive care include? A yearly physical exam. This is also called an annual well check. Dental exams once or twice a year. Routine eye exams. Ask your health care provider how often you should have your eyes checked. Personal lifestyle choices, including: Daily care of your teeth and gums. Regular physical activity. Eating a healthy diet. Avoiding tobacco and drug use. Limiting alcohol use. Practicing safe sex. Taking low-dose aspirin every day. Taking  vitamin and mineral supplements as recommended by your health care provider. What happens during an annual well check? The services and screenings done by your health care provider during your annual well check will depend on your age, overall health, lifestyle risk factors, and family history of disease. Counseling  Your health care provider may ask you questions about your: Alcohol use. Tobacco use. Drug use. Emotional well-being. Home and relationship well-being. Sexual activity. Eating habits. History of falls. Memory and ability to understand (cognition). Work and work Statistician. Reproductive health. Screening  You may have the following tests or measurements: Height, weight, and BMI. Blood pressure. Lipid and cholesterol levels. These may be checked every 5 years, or more frequently if you are over 61 years old. Skin check. Lung cancer screening. You may have this screening every year starting at age 34 if you have a 30-pack-year history of smoking and currently smoke or have quit within the past 15 years. Fecal occult blood test (FOBT) of the stool. You may have this test every year starting at age 30. Flexible sigmoidoscopy or colonoscopy. You may have a sigmoidoscopy every 5 years or a colonoscopy every 10 years starting at age 6. Hepatitis C blood test. Hepatitis B blood test. Sexually transmitted disease (STD) testing. Diabetes screening. This is done by checking your blood sugar (glucose) after you have not eaten for a while (fasting). You may have this done  every 1-3 years. Bone density scan. This is done to screen for osteoporosis. You may have this done starting at age 31. Mammogram. This may be done every 1-2 years. Talk to your health care provider about how often you should have regular mammograms. Talk with your health care provider about your test results, treatment options, and if necessary, the need for more tests. Vaccines  Your health care provider may  recommend certain vaccines, such as: Influenza vaccine. This is recommended every year. Tetanus, diphtheria, and acellular pertussis (Tdap, Td) vaccine. You may need a Td booster every 10 years. Zoster vaccine. You may need this after age 37. Pneumococcal 13-valent conjugate (PCV13) vaccine. One dose is recommended after age 23. Pneumococcal polysaccharide (PPSV23) vaccine. One dose is recommended after age 47. Talk to your health care provider about which screenings and vaccines you need and how often you need them. This information is not intended to replace advice given to you by your health care provider. Make sure you discuss any questions you have with your health care provider. Document Released: 11/19/2015 Document Revised: 07/12/2016 Document Reviewed: 08/24/2015 Elsevier Interactive Patient Education  2017 [place] Prevention in the Home Falls can cause injuries. They can happen to people of all ages. There are many things you can do to make your home safe and to help prevent falls. What can I do on the outside of my home? Regularly fix the edges of walkways and driveways and fix any cracks. Remove anything that might make you trip as you walk through a door, such as a raised step or threshold. Trim any bushes or trees on the path to your home. Use bright outdoor lighting. Clear any walking paths of anything that might make someone trip, such as rocks or tools. Regularly check to see if handrails are loose or broken. Make sure that both sides of any steps have handrails. Any raised decks and porches should have guardrails on the edges. Have any leaves, snow, or ice cleared regularly. Use sand or salt on walking paths during winter. Clean up any spills in your garage right away. This includes oil or grease spills. What can I do in the bathroom? Use night lights. Install grab bars by the toilet and in the tub and shower. Do not use towel bars as grab bars. Use non-skid  mats or decals in the tub or shower. If you need to sit down in the shower, use a plastic, non-slip stool. Keep the floor dry. Clean up any water that spills on the floor as soon as it happens. Remove soap buildup in the tub or shower regularly. Attach bath mats securely with double-sided non-slip rug tape. Do not have throw rugs and other things on the floor that can make you trip. What can I do in the bedroom? Use night lights. Make sure that you have a light by your bed that is easy to reach. Do not use any sheets or blankets that are too big for your bed. They should not hang down onto the floor. Have a firm chair that has side arms. You can use this for support while you get dressed. Do not have throw rugs and other things on the floor that can make you trip. What can I do in the kitchen? Clean up any spills right away. Avoid walking on wet floors. Keep items that you use a lot in easy-to-reach places. If you need to reach something above you, use a strong step stool that has  a grab bar. Keep electrical cords out of the way. Do not use floor polish or wax that makes floors slippery. If you must use wax, use non-skid floor wax. Do not have throw rugs and other things on the floor that can make you trip. What can I do with my stairs? Do not leave any items on the stairs. Make sure that there are handrails on both sides of the stairs and use them. Fix handrails that are broken or loose. Make sure that handrails are as long as the stairways. Check any carpeting to make sure that it is firmly attached to the stairs. Fix any carpet that is loose or worn. Avoid having throw rugs at the top or bottom of the stairs. If you do have throw rugs, attach them to the floor with carpet tape. Make sure that you have a light switch at the top of the stairs and the bottom of the stairs. If you do not have them, ask someone to add them for you. What else can I do to help prevent falls? Wear shoes  that: Do not have high heels. Have rubber bottoms. Are comfortable and fit you well. Are closed at the toe. Do not wear sandals. If you use a stepladder: Make sure that it is fully opened. Do not climb a closed stepladder. Make sure that both sides of the stepladder are locked into place. Ask someone to hold it for you, if possible. Clearly mark and make sure that you can see: Any grab bars or handrails. First and last steps. Where the edge of each step is. Use tools that help you move around (mobility aids) if they are needed. These include: Canes. Walkers. Scooters. Crutches. Turn on the lights when you go into a dark area. Replace any light bulbs as soon as they burn out. Set up your furniture so you have a clear path. Avoid moving your furniture around. If any of your floors are uneven, fix them. If there are any pets around you, be aware of where they are. Review your medicines with your doctor. Some medicines can make you feel dizzy. This can increase your chance of falling. Ask your doctor what other things that you can do to help prevent falls. This information is not intended to replace advice given to you by your health care provider. Make sure you discuss any questions you have with your health care provider. Document Released: 08/19/2009 Document Revised: 03/30/2016 Document Reviewed: 11/27/2014 Elsevier Interactive Patient Education  2017 [employer].

## 2023-02-15 ENCOUNTER — Encounter: Payer: Self-pay | Admitting: Family Medicine

## 2023-02-15 ENCOUNTER — Ambulatory Visit (INDEPENDENT_AMBULATORY_CARE_PROVIDER_SITE_OTHER): Payer: Medicare Other | Admitting: Family Medicine

## 2023-02-15 VITALS — BP 143/82 | HR 89 | Temp 98.2°F | Wt 144.6 lb

## 2023-02-15 DIAGNOSIS — G629 Polyneuropathy, unspecified: Secondary | ICD-10-CM | POA: Diagnosis not present

## 2023-02-15 DIAGNOSIS — M7989 Other specified soft tissue disorders: Secondary | ICD-10-CM | POA: Diagnosis not present

## 2023-02-15 DIAGNOSIS — E785 Hyperlipidemia, unspecified: Secondary | ICD-10-CM | POA: Diagnosis not present

## 2023-02-15 DIAGNOSIS — Z8669 Personal history of other diseases of the nervous system and sense organs: Secondary | ICD-10-CM | POA: Diagnosis not present

## 2023-02-15 DIAGNOSIS — J180 Bronchopneumonia, unspecified organism: Secondary | ICD-10-CM | POA: Diagnosis not present

## 2023-02-15 DIAGNOSIS — R918 Other nonspecific abnormal finding of lung field: Secondary | ICD-10-CM | POA: Diagnosis not present

## 2023-02-15 DIAGNOSIS — K449 Diaphragmatic hernia without obstruction or gangrene: Secondary | ICD-10-CM | POA: Diagnosis not present

## 2023-02-15 DIAGNOSIS — R0902 Hypoxemia: Secondary | ICD-10-CM

## 2023-02-15 DIAGNOSIS — R0602 Shortness of breath: Secondary | ICD-10-CM | POA: Diagnosis not present

## 2023-02-15 DIAGNOSIS — J9601 Acute respiratory failure with hypoxia: Secondary | ICD-10-CM | POA: Diagnosis not present

## 2023-02-15 DIAGNOSIS — I10 Essential (primary) hypertension: Secondary | ICD-10-CM | POA: Diagnosis not present

## 2023-02-15 DIAGNOSIS — K219 Gastro-esophageal reflux disease without esophagitis: Secondary | ICD-10-CM | POA: Diagnosis not present

## 2023-02-15 LAB — POC COVID19 BINAXNOW: SARS Coronavirus 2 Ag: NEGATIVE

## 2023-02-15 MED ORDER — CEFTRIAXONE SODIUM 1 G IJ SOLR
1.0000 g | Freq: Once | INTRAMUSCULAR | Status: AC
  Administered 2023-02-15: 1 g via INTRAMUSCULAR

## 2023-02-15 NOTE — Addendum Note (Signed)
Addended by: Leonie Douglas on: 02/15/2023 05:13 PM   Modules accepted: Orders

## 2023-02-15 NOTE — Progress Notes (Signed)
OFFICE VISIT  02/15/2023  CC:  Chief Complaint  Patient presents with   Acute Visit    Coughing, SOB, back pain and chest congestion since "Sunday. She has not taken a Covid test.    Patient is a 77 y.o. female who presents accompanied by her son for respiratory symptoms.  HPI: Onset 5 d ago, nasal cong/sinus pressure, HA, cough. Progressive, some SOB last 1-2d, no fever.  Some loose Bms yesterday.  No n/v/abd pain. Never smoker.    Remote +hx of urosepsis.  Past Medical History:  Diagnosis Date   Anxiety and depression    Bilateral edema of lower extremity    Chronic pain syndrome    Dr. O'toole   Chronic renal insufficiency, stage 3 (moderate)    GFR 40s.  Renal u/s normal 06/2022   Frequent headaches    GERD (gastroesophageal reflux disease)    History of sepsis    UTI/admission   Hypercholesterolemia    Hypertension    Insomnia    Iron deficiency anemia    ?etiology.   Osteoarthritis, multiple sites    Feet, shoulders, back    Past Surgical History:  Procedure Laterality Date   APPENDECTOMY  1986   Bunion and hammertoe surgery, right     12" /22/2023 BUNION CORRECTION W/GREAT TOE FUSION, HAMMERTOE CORRECTION & METATARSAL SHORTENING 2ND, BONE GRAFTFROM HEEL   COLONOSCOPY  2018   normal ([facility] spec per pt report but that office has no records of her seeing them)   DEXA     01/2023 T score -2.3.  Rpt 2 yrs   ESOPHAGOGASTRODUODENOSCOPY     neg for bleeding ?eval    Outpatient Medications Prior to Visit  Medication Sig Dispense Refill   ALPRAZolam (XANAX) 1 MG tablet Take 1 tablet (1 mg total) by mouth 3 (three) times daily. 90 tablet 5   amLODipine (NORVASC) 10 MG tablet Take 1 tablet (10 mg total) by mouth daily. 90 tablet 1   Ascorbic Acid (VITAMIN C PO) Take by mouth daily.     aspirin 81 MG chewable tablet Chew 81 mg by mouth daily in the afternoon.     atorvastatin (LIPITOR) 20 MG tablet Take 1 tablet (20 mg total) by mouth daily. 90 tablet 1    Cholecalciferol (VITAMIN D3 PO) Take 50 mg by mouth daily.     Cyanocobalamin (VITAMIN B-12 PO) Take by mouth daily.     fluticasone (FLONASE) 50 MCG/ACT nasal spray SPRAY 1 SPRAY INTO BOTH NOSTRILS DAILY. 16 g 1   gabapentin (NEURONTIN) 100 MG capsule Take 1 capsule (100 mg total) by mouth in the morning, at noon, in the evening, and at bedtime. 360 capsule 1   HYDROcodone-acetaminophen (NORCO/VICODIN) 5-325 MG tablet 1 tab po bid prn pain 60 tablet 0   Iron (IRCON PO) Take 65 mg by mouth daily.     metoprolol tartrate (LOPRESSOR) 25 MG tablet Take 1 tablet (25 mg total) by mouth 2 (two) times daily. 180 tablet 1   Omega-3 Fatty Acids (FISH OIL PO) Take by mouth daily.     pantoprazole (PROTONIX) 40 MG tablet Take 1 tablet (40 mg total) by mouth daily. Take 1 tablet by mouth daily. 90 tablet 1   traZODone (DESYREL) 50 MG tablet Take 1 tablet (50 mg total) by mouth daily. 90 tablet 1   VITAMIN E PO Take by mouth daily.     No facility-administered medications prior to visit.    Allergies  Allergen Reactions   Tramadol  Itching    Review of Systems  As per HPI  PE:    02/15/2023    4:03 PM 02/07/2023    8:34 AM 01/04/2023    1:47 PM  Vitals with BMI  Height   5' 0.5"  Weight 144 lbs 10 oz 145 lbs 147 lbs 3 oz  BMI   28.26  Systolic 143  104  Diastolic 82  63  Pulse 89  66  02 sat 88% RA today   Physical Exam  VS: noted--normal. Gen: alert, NAD, acutely ill-APPEARING.  No distress HEENT: eyes without injection, drainage, or swelling.  Ears: EACs clear, TMs with normal light reflex and landmarks.  Nose: Clear rhinorrhea, with some dried, crusty exudate adherent to mildly injected mucosa.  No purulent d/c.  No paranasal sinus TTP.  No facial swelling.  Throat and mouth without focal lesion.  No pharyngial swelling, erythema, or exudate.   Neck: supple, no LAD.   LUNGS: CTA bilat, nonlabored resps.   CV: RRR, no m/r/g. EXT: no c/c/e SKIN: no rash   LABS:  Last CBC Lab  Results  Component Value Date   WBC 6.2 10/19/2021   HGB 11.8 (L) 10/19/2021   HCT 36.1 10/19/2021   MCV 88.1 10/19/2021   RDW 13.7 10/19/2021   PLT 248.0 10/19/2021   Last metabolic panel Lab Results  Component Value Date   GLUCOSE 83 10/04/2022   NA 140 10/04/2022   K 4.0 10/04/2022   CL 106 10/04/2022   CO2 29 10/04/2022   BUN 14 10/04/2022   CREATININE 1.04 10/04/2022   GFRNONAA 41 03/01/2021   CALCIUM 8.7 10/04/2022   PROT 5.9 (L) 06/28/2022   ALBUMIN 3.7 06/28/2022   BILITOT 0.6 06/28/2022   ALKPHOS 54 06/28/2022   AST 19 06/28/2022   ALT 13 06/28/2022   IMPRESSION AND PLAN:  Progressive URI with cough and congestion--needs empiric treatment for bacterial pneumonia.  Additionally, her oxygen is 85 to 90% here on room air. Needs further evaluation in the emergency department so she can get labs and chest x-ray with immediate results, consideration for admission to the hospital for oxygen support, IV fluids, and antibiotics.  COVID test negative here today. Gave Rocephin 1 g IM here in the office today.  Patient and son expressed understanding and agreement with the plan.  An After Visit Summary was printed and given to the patient.  FOLLOW UP: Return for To be determined based on evaluation in the emergency department/hospital.  Signed:  Santiago Bumpers, MD           02/15/2023

## 2023-02-16 ENCOUNTER — Inpatient Hospital Stay: Admission: RE | Admit: 2023-02-16 | Payer: Medicare Other | Source: Ambulatory Visit

## 2023-02-19 ENCOUNTER — Telehealth: Payer: Self-pay

## 2023-02-19 NOTE — Transitions of Care (Post Inpatient/ED Visit) (Signed)
   02/19/2023  Name: Milissa Swink MRN: [id number] DOB: 04/10/1946  Today's TOC FU Call Status: Today's TOC FU Call Status:: Unsuccessul Call (1st Attempt) Unsuccessful Call (1st Attempt) Date: 02/19/23  Attempted to reach the patient regarding the most recent Inpatient/ED visit.  Follow Up Plan: Additional outreach attempts will be made to reach the patient to complete the Transitions of Care (Post Inpatient/ED visit) call.     Antionette Fairy, RN,BSN,CCM [facility] Care Management Care Management Community Coordinator Direct Phone: [phone] Toll Free: [phone] Fax: [phone]

## 2023-02-20 ENCOUNTER — Telehealth: Payer: Self-pay

## 2023-02-20 NOTE — Transitions of Care (Post Inpatient/ED Visit) (Signed)
   02/20/2023  Name: Sheila Newman MRN: [id number] DOB: Oct 31, 1946  Today's TOC FU Call Status: Today's TOC FU Call Status:: Successful TOC FU Call Competed TOC FU Call Complete Date: 02/20/23  Transition Care Management Follow-up Telephone Call Date of Discharge: 02/17/23 Discharge Facility: Other ([facility]) Name of Other ([facility]) [facility]charge Facility: [facility] Type of Discharge: Inpatient Admission Primary Inpatient Discharge Diagnosis:: "PNA of left lower lobe due to infectious organism" How have you been since you were released from the hospital?: Better (Pt states she is "getting better." She is still coughing up clear phelgm at times. She took last dose of abx this morning. She gets SOB w/ exertion at times-relieved with rest-will talk to MD about prn inhaler to use at upcoming appt) Any questions or concerns?: No  Items Reviewed: Did you receive and understand the discharge instructions provided?: Yes Medications obtained and verified?: Yes (Medications Reviewed) Any new allergies since your discharge?: No Dietary orders reviewed?: Yes Type of Diet Ordered:: low salt/heart healthy Do you have support at home?: Yes People in Home: child(ren), adult Name of Support/Comfort Primary Source: son and daughter lives with he  Home Care and Equipment/Supplies: Were Home Health Services Ordered?: NA Any new equipment or medical supplies ordered?: NA  Functional Questionnaire: Do you need assistance with bathing/showering or dressing?: No Do you need assistance with meal preparation?: No Do you need assistance with eating?: No Do you have difficulty maintaining continence: No Do you need assistance with getting out of bed/getting out of a chair/moving?: No Do you have difficulty managing or taking your medications?: No  Follow up appointments reviewed: PCP Follow-up appointment confirmed?: Yes Date of PCP follow-up appointment?:  02/23/23 Follow-up Provider: Dr. Milinda Cave Specialist [facility] Follow-up appointment confirmed?: NA Do you need transportation to your follow-up appointment?: No Do you understand care options if your condition(s) worsen?: Yes-patient verbalized understanding  SDOH Interventions Today    Flowsheet Row Most Recent Value  SDOH Interventions   Food Insecurity Interventions Intervention Not Indicated  Transportation Interventions Intervention Not Indicated      TOC Interventions Today    Flowsheet Row Most Recent Value  TOC Interventions   TOC Interventions Discussed/Reviewed TOC Interventions Discussed, S/S of infection      Interventions Today    Flowsheet Row Most Recent Value  General Interventions   General Interventions Discussed/Reviewed General Interventions Discussed, Doctor Visits  Doctor Visits Discussed/Reviewed PCP, Doctor Visits Discussed  PCP/Specialist Visits Compliance with follow-up visit  Education Interventions   Education Provided Provided Education  Provided Verbal Education On Nutrition, When to see the doctor, Medication, Other  [resp sx mgmt, vaccine-pt states she is up to date on PNA vaccine]  Nutrition Interventions   Nutrition Discussed/Reviewed Nutrition Discussed, Adding fruits and vegetables, Decreasing salt  Pharmacy Interventions   Pharmacy Dicussed/Reviewed Pharmacy Topics Discussed, Medications and their functions  Safety Interventions   Safety Discussed/Reviewed Safety Discussed       Alessandra Grout [facility] Care Management Care Management Community Coordinator Direct Phone: [phone] Toll Free: [phone] Fax: [phone]

## 2023-02-23 ENCOUNTER — Ambulatory Visit (INDEPENDENT_AMBULATORY_CARE_PROVIDER_SITE_OTHER): Payer: Medicare Other | Admitting: Family Medicine

## 2023-02-23 ENCOUNTER — Encounter: Payer: Self-pay | Admitting: Family Medicine

## 2023-02-23 VITALS — BP 127/74 | HR 64 | Wt 141.8 lb

## 2023-02-23 DIAGNOSIS — J9601 Acute respiratory failure with hypoxia: Secondary | ICD-10-CM

## 2023-02-23 DIAGNOSIS — J180 Bronchopneumonia, unspecified organism: Secondary | ICD-10-CM

## 2023-02-23 DIAGNOSIS — R062 Wheezing: Secondary | ICD-10-CM

## 2023-02-23 MED ORDER — PREDNISONE 20 MG PO TABS
ORAL_TABLET | ORAL | 0 refills | Status: DC
Start: 2023-02-23 — End: 2023-03-05

## 2023-02-23 MED ORDER — ALBUTEROL SULFATE HFA 108 (90 BASE) MCG/ACT IN AERS: 2.0000 | INHALATION_SPRAY | Freq: Four times a day (QID) | RESPIRATORY_TRACT | 0 refills | Status: AC | PRN

## 2023-02-23 NOTE — Progress Notes (Signed)
02/23/2023  CC:  Chief Complaint  Patient presents with   Follow-up    Hospital follow up. Wants to discuss the use of a humidifier.     Patient is a 77 y.o. Caucasian female who presents for  hospital follow up, specifically Transitional Care Services face-to-face visit. Dates hospitalized: 02/15/2023 to 02/17/2023 Days since d/c from hospital: 6 days Patient was discharged from hospital to home Reason for admission to hospital: Pneumonia, acute hypoxemic respiratory failure. Date of interactive (phone) contact with patient and/or caregiver: 02/20/2023  I have reviewed patient's discharge summary plus pertinent specific notes, labs, and imaging from the hospitalization.   Her respiratory panel was positive for human metapneumovirus.  They did not suspect that she had secondary bacterial pneumonia but given the dramatic improvement with antibiotics she was sent home on Omnicef.  She also completed a 3-day course of azithromycin.  She did not need any steroids. CBC with differential was normal, complete metabolic panel normal except serum creatinine 1.17 (GFR 49), which is her baseline.  Currently: Feeling only improved.  No fevers.  She has some cough and clear mucus production.  Still feeling some shortness of breath when walking around a lot.  Some feeling of tightness in her chest and hears occasional wheezing. Took 2 puffs of her son's albuterol inhaler and noted some improvement. She finished antibiotics as prescribed. Appetite is good. She got up at 7 this morning and mowed her lawn already!  Medication reconciliation was done today and patient  is taking meds as recommended by discharging hospitalist/specialist.    ROS as above, plus--> no CP, no dizziness, no HAs, no rashes No recent changes in lower legs. No n/v/d or abd pain.  No palpitations.    PMH:  Past Medical History:  Diagnosis Date   Anxiety and depression    Bilateral edema of lower extremity    Chronic pain  syndrome    Dr. Laurian Brim   Chronic renal insufficiency, stage 3 (moderate)    GFR 40s.  Renal u/s normal 06/2022   Frequent headaches    GERD (gastroesophageal reflux disease)    History of sepsis    UTI/admission   Hypercholesterolemia    Hypertension    Insomnia    Iron deficiency anemia    ?etiology.   Osteoarthritis, multiple sites    Feet, shoulders, back    PSH:  Past Surgical History:  Procedure Laterality Date   APPENDECTOMY  1986   Bunion and hammertoe surgery, right     10/27/2022 BUNION CORRECTION W/GREAT TOE FUSION, HAMMERTOE CORRECTION & METATARSAL SHORTENING 2ND, BONE GRAFTFROM HEEL   COLONOSCOPY  2018   normal (dig [facility] spec per pt report but that office has no records of her seeing them)   DEXA     01/2023 T score -2.3.  Rpt 2 yrs   ESOPHAGOGASTRODUODENOSCOPY     neg for bleeding ?eval    MEDS:  Outpatient Medications Prior to Visit  Medication Sig Dispense Refill   ALPRAZolam (XANAX) 1 MG tablet Take 1 tablet (1 mg total) by mouth 3 (three) times daily. 90 tablet 5   amLODipine (NORVASC) 10 MG tablet Take 1 tablet (10 mg total) by mouth daily. 90 tablet 1   Ascorbic Acid (VITAMIN C PO) Take by mouth daily.     aspirin 81 MG chewable tablet Chew 81 mg by mouth daily in the afternoon.     atorvastatin (LIPITOR) 20 MG tablet Take 1 tablet (20 mg total) by mouth daily. 90 tablet  1   Cholecalciferol (VITAMIN D3 PO) Take 50 mg by mouth daily.     Cyanocobalamin (VITAMIN B-12 PO) Take by mouth daily.     fluticasone (FLONASE) 50 MCG/ACT nasal spray SPRAY 1 SPRAY INTO BOTH NOSTRILS DAILY. 16 g 1   gabapentin (NEURONTIN) 100 MG capsule Take 1 capsule (100 mg total) by mouth in the morning, at noon, in the evening, and at bedtime. 360 capsule 1   HYDROcodone-acetaminophen (NORCO/VICODIN) 5-325 MG tablet 1 tab po bid prn pain 60 tablet 0   Iron (IRCON PO) Take 65 mg by mouth daily.     metoprolol tartrate (LOPRESSOR) 25 MG tablet Take 1 tablet (25 mg total) by mouth 2  (two) times daily. 180 tablet 1   Omega-3 Fatty Acids (FISH OIL PO) Take by mouth daily.     pantoprazole (PROTONIX) 40 MG tablet Take 1 tablet (40 mg total) by mouth daily. Take 1 tablet by mouth daily. 90 tablet 1   traZODone (DESYREL) 50 MG tablet Take 1 tablet (50 mg total) by mouth daily. 90 tablet 1   VITAMIN E PO Take by mouth daily.     No facility-administered medications prior to visit.    Physical Exam     02/23/2023   11:20 AM 02/15/2023    4:03 PM 02/07/2023    8:34 AM  Vitals with BMI  Weight 141 lbs 13 oz 144 lbs 10 oz 145 lbs  Systolic 127 143   Diastolic 74 82   Pulse 64 89    02 sat 97% RA  Gen: Alert, well appearing.  Patient is oriented to person, place, time, and situation. CV: RRR, no m/r/g.   LUNGS: CTA bilat on inspiration.  She has a bit of coarse and expiratory wheeze in the bases, left greater than right.  Nonlabored resps, good aeration in all lung fields. EXT: no clubbing or cyanosis.  no edema.    Pertinent labs/imaging Last CBC Lab Results  Component Value Date   WBC 6.2 10/19/2021   HGB 11.8 (L) 10/19/2021   HCT 36.1 10/19/2021   MCV 88.1 10/19/2021   RDW 13.7 10/19/2021   PLT 248.0 10/19/2021   Last metabolic panel Lab Results  Component Value Date   GLUCOSE 83 10/04/2022   NA 140 10/04/2022   K 4.0 10/04/2022   CL 106 10/04/2022   CO2 29 10/04/2022   BUN 14 10/04/2022   CREATININE 1.04 10/04/2022   GFRNONAA 41 03/01/2021   CALCIUM 8.7 10/04/2022   PROT 5.9 (L) 06/28/2022   ALBUMIN 3.7 06/28/2022   BILITOT 0.6 06/28/2022   ALKPHOS 54 06/28/2022   AST 19 06/28/2022   ALT 13 06/28/2022   ASSESSMENT/PLAN:  Acute hypoxic respite failure due to viral pneumonia. There was a lower suspicion of superinfection with bacteria. She finished a course of antibiotics.  She is significantly improved and no longer hypoxic. I do think she has reactive airway disease component at this time.  And prednisone 40 mg a day x 5 days, then 20 mg a  day x 5 days. Albuterol inhaler, 2 puffs every 6 hours as needed --prescribed as well.  Medical decision making of moderate complexity was utilized today.  FOLLOW UP: 7 to 10 days  Signed:  Santiago Bumpers, MD           02/23/2023

## 2023-03-05 ENCOUNTER — Ambulatory Visit (INDEPENDENT_AMBULATORY_CARE_PROVIDER_SITE_OTHER): Payer: Medicare Other | Admitting: Family Medicine

## 2023-03-05 ENCOUNTER — Encounter: Payer: Self-pay | Admitting: Family Medicine

## 2023-03-05 VITALS — BP 119/68 | HR 65 | Temp 98.5°F | Ht 60.5 in | Wt 143.2 lb

## 2023-03-05 DIAGNOSIS — J189 Pneumonia, unspecified organism: Secondary | ICD-10-CM | POA: Diagnosis not present

## 2023-03-05 NOTE — Progress Notes (Signed)
OFFICE VISIT  03/05/2023  CC:  Chief Complaint  Patient presents with   Follow-up    10 d, bronchopneumonia; pt states she is feeling better    Patient is a 77 y.o. female who presents for 10-day follow-up bronchopneumonia. A/P as of last visit: "Acute hypoxic respite failure due to viral pneumonia. There was a lower suspicion of superinfection with bacteria. She finished a course of antibiotics.  She is significantly improved and no longer hypoxic. I do think she has reactive airway disease component at this time. And prednisone 40 mg a day x 5 days, then 20 mg a day x 5 days. Albuterol inhaler, 2 puffs every 6 hours as needed --prescribed as well."  INTERIM HX: She feels essentially back to her baseline. She worked in her yard about 8 hours on and off a couple days ago. 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  Past Medical History:  Diagnosis Date   Anxiety and depression    Bilateral edema of lower extremity    Chronic pain syndrome    Dr. Laurian Brim   Chronic renal insufficiency, stage 3 (moderate) (HCC)    GFR 40s.  Renal u/s normal 06/2022   Frequent headaches    GERD (gastroesophageal reflux disease)    History of sepsis    UTI/admission   Hypercholesterolemia    Hypertension    Insomnia    Iron deficiency anemia    ?etiology.   Osteoarthritis, multiple sites    Feet, shoulders, back    Past Surgical History:  Procedure Laterality Date   APPENDECTOMY  1986   Bunion and hammertoe surgery, right     10/27/2022 BUNION CORRECTION W/GREAT TOE FUSION, HAMMERTOE CORRECTION & METATARSAL SHORTENING 2ND, BONE GRAFTFROM HEEL   COLONOSCOPY  2018   normal (dig [facility]a spec per pt report but that office has no records of her seeing them)   DEXA     01/2023 T score -2.3.  Rpt 2 yrs   ESOPHAGOGASTRODUODENOSCOPY     neg for bleeding ?eval    Outpatient Medications Prior to Visit  Medication Sig Dispense Refill   albuterol (VENTOLIN HFA)  108 (90 Base) MCG/ACT inhaler Inhale 2 puffs into the lungs every 6 (six) hours as needed for wheezing or shortness of breath. 18 g 0   ALPRAZolam (XANAX) 1 MG tablet Take 1 tablet (1 mg total) by mouth 3 (three) times daily. 90 tablet 5   amLODipine (NORVASC) 10 MG tablet Take 1 tablet (10 mg total) by mouth daily. 90 tablet 1   Ascorbic Acid (VITAMIN C PO) Take by mouth daily.     aspirin 81 MG chewable tablet Chew 81 mg by mouth daily in the afternoon.     atorvastatin (LIPITOR) 20 MG tablet Take 1 tablet (20 mg total) by mouth daily. 90 tablet 1   Cholecalciferol (VITAMIN D3 PO) Take 50 mg by mouth daily.     Cyanocobalamin (VITAMIN B-12 PO) Take by mouth daily.     fluticasone (FLONASE) 50 MCG/ACT nasal spray SPRAY 1 SPRAY INTO BOTH NOSTRILS DAILY. 16 g 1   gabapentin (NEURONTIN) 100 MG capsule Take 1 capsule (100 mg total) by mouth in the morning, at noon, in the evening, and at bedtime. 360 capsule 1   HYDROcodone-acetaminophen (NORCO/VICODIN) 5-325 MG tablet 1 tab po bid prn pain 60 tablet 0   Iron (IRCON PO) Take 65 mg by mouth daily.     metoprolol tartrate (LOPRESSOR) 25 MG tablet Take 1 tablet (  25 mg total) by mouth 2 (two) times daily. 180 tablet 1   Omega-3 Fatty Acids (FISH OIL PO) Take by mouth daily.     pantoprazole (PROTONIX) 40 MG tablet Take 1 tablet (40 mg total) by mouth daily. Take 1 tablet by mouth daily. 90 tablet 1   traZODone (DESYREL) 50 MG tablet Take 1 tablet (50 mg total) by mouth daily. 90 tablet 1   VITAMIN E PO Take by mouth daily.     predniSONE (DELTASONE) 20 MG tablet 2 tabs po qd x 5d then 1 tab po qd x 5d (Patient not taking: Reported on 03/05/2023) 15 tablet 0   No facility-administered medications prior to visit.    Allergies  Allergen Reactions   Tramadol Itching    Review of Systems As per HPI  PE:    03/05/2023    1:19 PM 02/23/2023   11:20 AM 02/15/2023    4:03 PM  Vitals with BMI  Height 5' 0.5"    Weight 143 lbs 3 oz 141 lbs 13 oz 144  lbs 10 oz  BMI 27.5    Systolic 119 127 098  Diastolic 68 74 82  Pulse 65 64 89     Physical Exam  Gen: Alert, well appearing.  Patient is oriented to person, place, time, and situation. AFFECT: pleasant, lucid thought and speech. CV: RRR, no m/r/g.   LUNGS: CTA bilat, nonlabored resps, good aeration in all lung fields.   LABS:  Last CBC Lab Results  Component Value Date   WBC 6.2 10/19/2021   HGB 11.8 (L) 10/19/2021   HCT 36.1 10/19/2021   MCV 88.1 10/19/2021   RDW 13.7 10/19/2021   PLT 248.0 10/19/2021   Last metabolic panel Lab Results  Component Value Date   GLUCOSE 83 10/04/2022   NA 140 10/04/2022   K 4.0 10/04/2022   CL 106 10/04/2022   CO2 29 10/04/2022   BUN 14 10/04/2022   CREATININE 1.04 10/04/2022   GFRNONAA 41 03/01/2021   CALCIUM 8.7 10/04/2022   PROT 5.9 (L) 06/28/2022   ALBUMIN 3.7 06/28/2022   BILITOT 0.6 06/28/2022   ALKPHOS 54 06/28/2022   AST 19 06/28/2022   ALT 13 06/28/2022    IMPRESSION AND PLAN:  #1 acute bronchopneumonia, left upper lobe.  She is clinically back to her baseline state of health--doing very well. Her chest x-ray was on April 11. I will order CXR to document radiographic resolution and she will go to get this approximately 2 weeks from now.  An After Visit Summary was printed and given to the patient.  FOLLOW UP: Keep appointment set for 04/04/2023  Signed:  Santiago Bumpers, MD           03/05/2023

## 2023-03-07 ENCOUNTER — Other Ambulatory Visit: Payer: Self-pay | Admitting: Family Medicine

## 2023-03-07 MED ORDER — HYDROCODONE-ACETAMINOPHEN 5-325 MG PO TABS: ORAL_TABLET | ORAL | 0 refills | Status: DC

## 2023-03-07 NOTE — Telephone Encounter (Signed)
Requesting: Norco Contract: 10/04/22 UDS: 10/04/22 Last Visit: 03/05/23 Next Visit: 04/04/23 Last Refill: 02/05/23 (60,0)  Please Advise. Med pending

## 2023-03-07 NOTE — Telephone Encounter (Signed)
Patient needs refill on HYDROcodone-acetaminophen (NORCO/VICODIN) 5-325 MG table  Pharmacy is [facility] in [facility]

## 2023-03-15 ENCOUNTER — Other Ambulatory Visit: Payer: Self-pay | Admitting: Family Medicine

## 2023-03-17 ENCOUNTER — Other Ambulatory Visit: Payer: Self-pay | Admitting: Family Medicine

## 2023-03-19 ENCOUNTER — Ambulatory Visit (INDEPENDENT_AMBULATORY_CARE_PROVIDER_SITE_OTHER): Payer: Medicare Other

## 2023-03-19 DIAGNOSIS — J189 Pneumonia, unspecified organism: Secondary | ICD-10-CM | POA: Diagnosis not present

## 2023-03-19 DIAGNOSIS — K449 Diaphragmatic hernia without obstruction or gangrene: Secondary | ICD-10-CM | POA: Diagnosis not present

## 2023-03-23 ENCOUNTER — Other Ambulatory Visit: Payer: Self-pay | Admitting: Family Medicine

## 2023-03-23 NOTE — Telephone Encounter (Signed)
Requesting: alprazolam Contract: 10/04/22 UDS: 10/04/22 Last Visit: 03/05/23 Next Visit: 04/11/23 Last Refill:  01/04/23 (90,5)  Please deny, too early for refills

## 2023-04-04 ENCOUNTER — Ambulatory Visit: Payer: Medicare Other | Admitting: Family Medicine

## 2023-04-06 ENCOUNTER — Other Ambulatory Visit: Payer: Self-pay | Admitting: Family Medicine

## 2023-04-06 MED ORDER — HYDROCODONE-ACETAMINOPHEN 5-325 MG PO TABS: ORAL_TABLET | ORAL | 0 refills | Status: DC

## 2023-04-06 NOTE — Telephone Encounter (Signed)
Patient is requesting medication refill on Hydrocodone. Pharmacy is confirmed as [facility]

## 2023-04-06 NOTE — Telephone Encounter (Signed)
Requesting: Norco Contract: 10/04/22 UDS: 10/04/22 Last Visit: 03/05/23, acute Next Visit: 04/11/23 Last Refill: 03/07/23 (60,0)  Please Advise. Med pending

## 2023-04-11 ENCOUNTER — Ambulatory Visit (INDEPENDENT_AMBULATORY_CARE_PROVIDER_SITE_OTHER): Payer: Medicare Other | Admitting: Family Medicine

## 2023-04-11 ENCOUNTER — Encounter: Payer: Self-pay | Admitting: Family Medicine

## 2023-04-11 VITALS — BP 104/59 | HR 49 | Ht 60.5 in | Wt 139.8 lb

## 2023-04-11 DIAGNOSIS — M19041 Primary osteoarthritis, right hand: Secondary | ICD-10-CM

## 2023-04-11 DIAGNOSIS — M159 Polyosteoarthritis, unspecified: Secondary | ICD-10-CM

## 2023-04-11 DIAGNOSIS — G894 Chronic pain syndrome: Secondary | ICD-10-CM

## 2023-04-11 DIAGNOSIS — N1831 Chronic kidney disease, stage 3a: Secondary | ICD-10-CM

## 2023-04-11 DIAGNOSIS — I10 Essential (primary) hypertension: Secondary | ICD-10-CM | POA: Diagnosis not present

## 2023-04-11 DIAGNOSIS — E7800 Pure hypercholesterolemia, unspecified: Secondary | ICD-10-CM

## 2023-04-11 MED ORDER — TRIAMCINOLONE ACETONIDE 40 MG/ML IJ SUSP
20.0000 mg | Freq: Once | INTRAMUSCULAR | Status: AC
Start: 2023-04-11 — End: 2023-04-11
  Administered 2023-04-11: 20 mg via INTRA_ARTICULAR

## 2023-04-11 NOTE — Progress Notes (Signed)
OFFICE VISIT  04/11/2023  CC:  Chief Complaint  Patient presents with   Medical Management of Chronic Issues    Pt is fasting    Patient is a 77 y.o. female who presents for follow-up chronic pain syndrome, hypertension, hyperlipidemia, and chronic renal insufficiency stage III.  INTERIM HX: Having pain in her hands worse lately, particularly right index finger and right pinky finger. Chronic hand pain diffusely for years.  Occasional triggering of the right index finger.  Sometimes the aching goes all the way up into her forearms.   Her other areas of chronic pain seem to be stable. 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  Not NSAID candidate d/t CRI. Medication and dose: vicodin 5/325, 1 bid # pills per month: 60 PMP AWARE reviewed today: most recent rx for Vicodin was filled 04/06/2023, # 60, rx by me.  Most recent alprazolam prescription filled 03/23/2023, #90, prescription by me. No red flags.  Past Medical History:  Diagnosis Date   Anxiety and depression    Bilateral edema of lower extremity    Chronic pain syndrome    Dr. Laurian Brim   Chronic renal insufficiency, stage 3 (moderate) (HCC)    GFR 40s.  Renal u/s normal 06/2022   Frequent headaches    GERD (gastroesophageal reflux disease)    History of sepsis    UTI/admission   Hypercholesterolemia    Hypertension    Insomnia    Iron deficiency anemia    ?etiology.   Osteoarthritis, multiple sites    Feet, shoulders, back    Past Surgical History:  Procedure Laterality Date   APPENDECTOMY  1986   Bunion and hammertoe surgery, right     10/27/2022 BUNION CORRECTION W/GREAT TOE FUSION, HAMMERTOE CORRECTION & METATARSAL SHORTENING 2ND, BONE GRAFTFROM HEEL   COLONOSCOPY  2018   normal (dig hea spec per pt report but that office has no records of her seeing them)   DEXA     01/2023 T score -2.3.  Rpt 2 yrs   ESOPHAGOGASTRODUODENOSCOPY      neg for bleeding ?eval    Outpatient Medications Prior to Visit  Medication Sig Dispense Refill   albuterol (VENTOLIN HFA) 108 (90 Base) MCG/ACT inhaler Inhale 2 puffs into the lungs every 6 (six) hours as needed for wheezing or shortness of breath. 18 g 0   ALPRAZolam (XANAX) 1 MG tablet Take 1 tablet (1 mg total) by mouth 3 (three) times daily. 90 tablet 5   amLODipine (NORVASC) 10 MG tablet Take 1 tablet (10 mg total) by mouth daily. 90 tablet 1   Ascorbic Acid (VITAMIN C PO) Take by mouth daily.     aspirin 81 MG chewable tablet Chew 81 mg by mouth daily in the afternoon.     atorvastatin (LIPITOR) 20 MG tablet Take 1 tablet (20 mg total) by mouth daily. 90 tablet 1   Cholecalciferol (VITAMIN D3 PO) Take 50 mg by mouth daily.     Cyanocobalamin (VITAMIN B-12 PO) Take by mouth daily.     fluticasone (FLONASE) 50 MCG/ACT nasal spray INSTILL 1 SPRAY INTO BOTH NOSTRILS DAILY 48 mL 1   gabapentin (NEURONTIN) 100 MG capsule Take 1 capsule (100 mg total) by mouth in the morning, at noon, in the evening, and at bedtime. 360 capsule 1   HYDROcodone-acetaminophen (NORCO/VICODIN) 5-325 MG tablet 1 tab po bid prn pain 60 tablet 0   Iron (IRCON PO) Take 65 mg by  mouth daily.     metoprolol tartrate (LOPRESSOR) 25 MG tablet Take 1 tablet (25 mg total) by mouth 2 (two) times daily. 180 tablet 1   Omega-3 Fatty Acids (FISH OIL PO) Take by mouth daily.     pantoprazole (PROTONIX) 40 MG tablet Take 1 tablet (40 mg total) by mouth daily. Take 1 tablet by mouth daily. 90 tablet 1   traZODone (DESYREL) 50 MG tablet Take 1 tablet (50 mg total) by mouth daily. 90 tablet 1   VITAMIN E PO Take by mouth daily.     No facility-administered medications prior to visit.    Allergies  Allergen Reactions   Tramadol Itching    Review of Systems As per HPI  PE:    04/11/2023   10:20 AM 03/05/2023    1:19 PM 02/23/2023   11:20 AM  Vitals with BMI  Height 5' 0.5" 5' 0.5"   Weight 139 lbs 13 oz 143 lbs 3 oz  141 lbs 13 oz  BMI 26.84 27.5   Systolic 104 119 161  Diastolic 59 68 74  Pulse 49 65 64     Physical Exam  Gen: Alert, well appearing.  Patient is oriented to person, place, time, and situation. AFFECT: pleasant, lucid thought and speech. Hands without erythema or warmth. Heberden's nodes present bilaterally mostly at the DIPs. Some discomfort to palpation in the joints of the hands diffusely but most significantly over the right hand index finger MCP and right fifth digit DIP. Range of motion of fingers full flexion and extension.  Mild flexion contracture of a few flexor tendons are subtly notable in the palms bilaterally.  No triggering.  LABS:  Last CBC Lab Results  Component Value Date   WBC 6.2 10/19/2021   HGB 11.8 (L) 10/19/2021   HCT 36.1 10/19/2021   MCV 88.1 10/19/2021   RDW 13.7 10/19/2021   PLT 248.0 10/19/2021   Last metabolic panel Lab Results  Component Value Date   GLUCOSE 83 10/04/2022   NA 140 10/04/2022   K 4.0 10/04/2022   CL 106 10/04/2022   CO2 29 10/04/2022   BUN 14 10/04/2022   CREATININE 1.04 10/04/2022   GFRNONAA 41 03/01/2021   CALCIUM 8.7 10/04/2022   PROT 5.9 (L) 06/28/2022   ALBUMIN 3.7 06/28/2022   BILITOT 0.6 06/28/2022   ALKPHOS 54 06/28/2022   AST 19 06/28/2022   ALT 13 06/28/2022   Last lipids Lab Results  Component Value Date   CHOL 157 06/28/2022   HDL 46.70 06/28/2022   LDLCALC 88 06/28/2022   TRIG 109.0 06/28/2022   CHOLHDL 3 06/28/2022   Last hemoglobin A1c Lab Results  Component Value Date   HGBA1C 5.4 10/04/2022   HGBA1C 5.4 10/04/2022   HGBA1C 5.4 (A) 10/04/2022   HGBA1C 5.4 10/04/2022   Last thyroid functions Lab Results  Component Value Date   TSH 2.10 03/01/2021   Last vitamin D Lab Results  Component Value Date   VD25OH 37.7 09/07/2022   IMPRESSION AND PLAN:  #1 hand osteoarthritis.  Most bothersome areas today are the right index MCP and right fifth digit DIP.   Reviewed with bedside  ultrasound today and the DIP had no sufficient area large enough to inject today. Therefore we did proceed only with a steroid injection of the right index MCP joint.  Ultrasound-guided injection is preferred based on studies that show increased duration, increased effect, greater accuracy, decreased procedural pain, increased response rate, and decreased cost with ultrasound-guided versus  blind injection. Procedure: Real-time ultrasound guided injection of right hand index finger MCP joint. Device: [employer] informed consent obtained.  Timeout conducted.  No overlying erythema, induration, or other signs of local infection. After sterile prep with Betadine, injected a mixture of 20 mg Kenalog and 1/2 mL of 1% plain lidocaine using 25-gauge 5/8 inch needle.  Injectate seen filling joint capsule. Patient tolerated the procedure well.  No immediate complications.  Post-injection care discussed. Advised to call if fever/chills, erythema, drainage, or persistent bleeding.  Impression: Technically successful ultrasound-guided injection.  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 Continue Vicodin 5-325, 1 twice daily as needed, number 60/month--> a new prescription was not needed today.  3.  Hypertension, well-controlled on amlodipine 10 mg a day and Lopressor 25 mg twice daily. Electrolytes and creatinine today.  4.  Hypercholesterolemia, doing well on atorvastatin 20 mg a day. Lipid panel today.  5.  Chronic renal insufficiency stage III. Avoiding NSAIDs. Electrolytes and creatinine monitoring today.  An After Visit Summary was printed and given to the patient.  FOLLOW UP: Return in about 3 months (around 07/12/2023) for annual CPE (fasting). Next cpe 3 mo Signed:  Santiago Bumpers, MD           04/11/2023

## 2023-04-12 LAB — COMPREHENSIVE METABOLIC PANEL
ALT: 11 U/L (ref 0–35)
AST: 18 U/L (ref 0–37)
Albumin: 3.8 g/dL (ref 3.5–5.2)
Alkaline Phosphatase: 72 U/L (ref 39–117)
BUN: 12 mg/dL (ref 6–23)
CO2: 26 mEq/L (ref 19–32)
Calcium: 9 mg/dL (ref 8.4–10.5)
Chloride: 104 mEq/L (ref 96–112)
Creatinine, Ser: 1.29 mg/dL — ABNORMAL HIGH (ref 0.40–1.20)
GFR: 40.64 mL/min — ABNORMAL LOW (ref >60.00)
Glucose, Bld: 66 mg/dL — ABNORMAL LOW (ref 70–99)
Potassium: 4.5 mEq/L (ref 3.5–5.1)
Sodium: 141 mEq/L (ref 135–145)
Total Bilirubin: 0.4 mg/dL (ref 0.2–1.2)
Total Protein: 6.1 g/dL (ref 6.0–8.3)

## 2023-04-12 LAB — LIPID PANEL
Cholesterol: 165 mg/dL (ref 0–200)
HDL: 56 mg/dL (ref >39.00)
LDL Cholesterol: 94 mg/dL (ref 0–99)
NonHDL: 108.68
Total CHOL/HDL Ratio: 3
Triglycerides: 75 mg/dL (ref 0.0–149.0)
VLDL: 15 mg/dL (ref 0.0–40.0)

## 2023-04-30 IMAGING — DX DG FOOT COMPLETE 3+V*R*
3 series · 3 of 3 positions shown · non-contrast
Comparison: None.

CLINICAL DATA: Right foot pain to second and third metatarsal
heads.

EXAM:
RIGHT FOOT COMPLETE - 3+ VIEW

[foot ap]
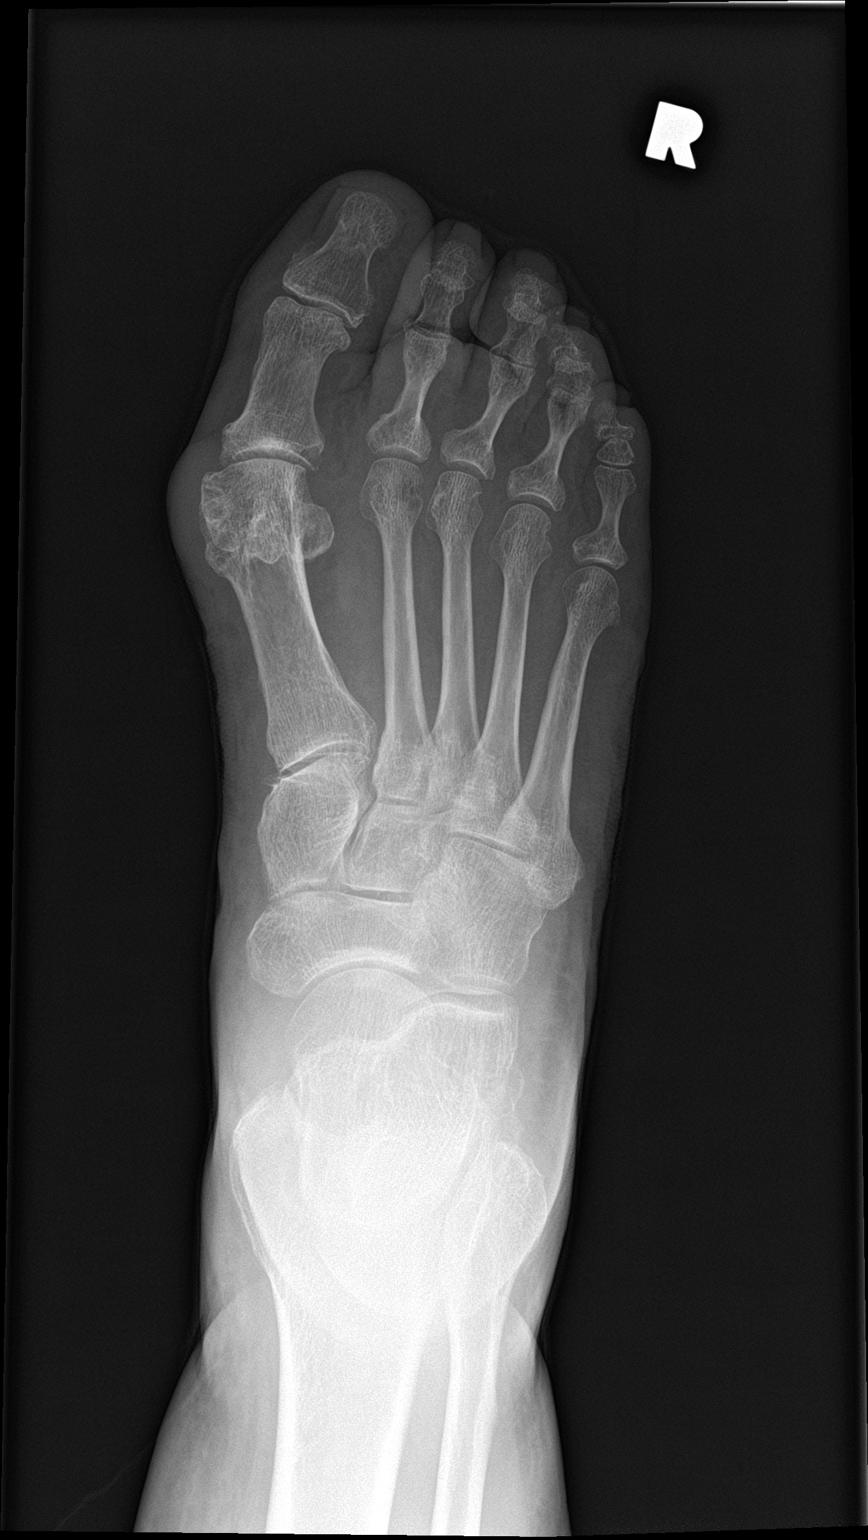

[foot obl]
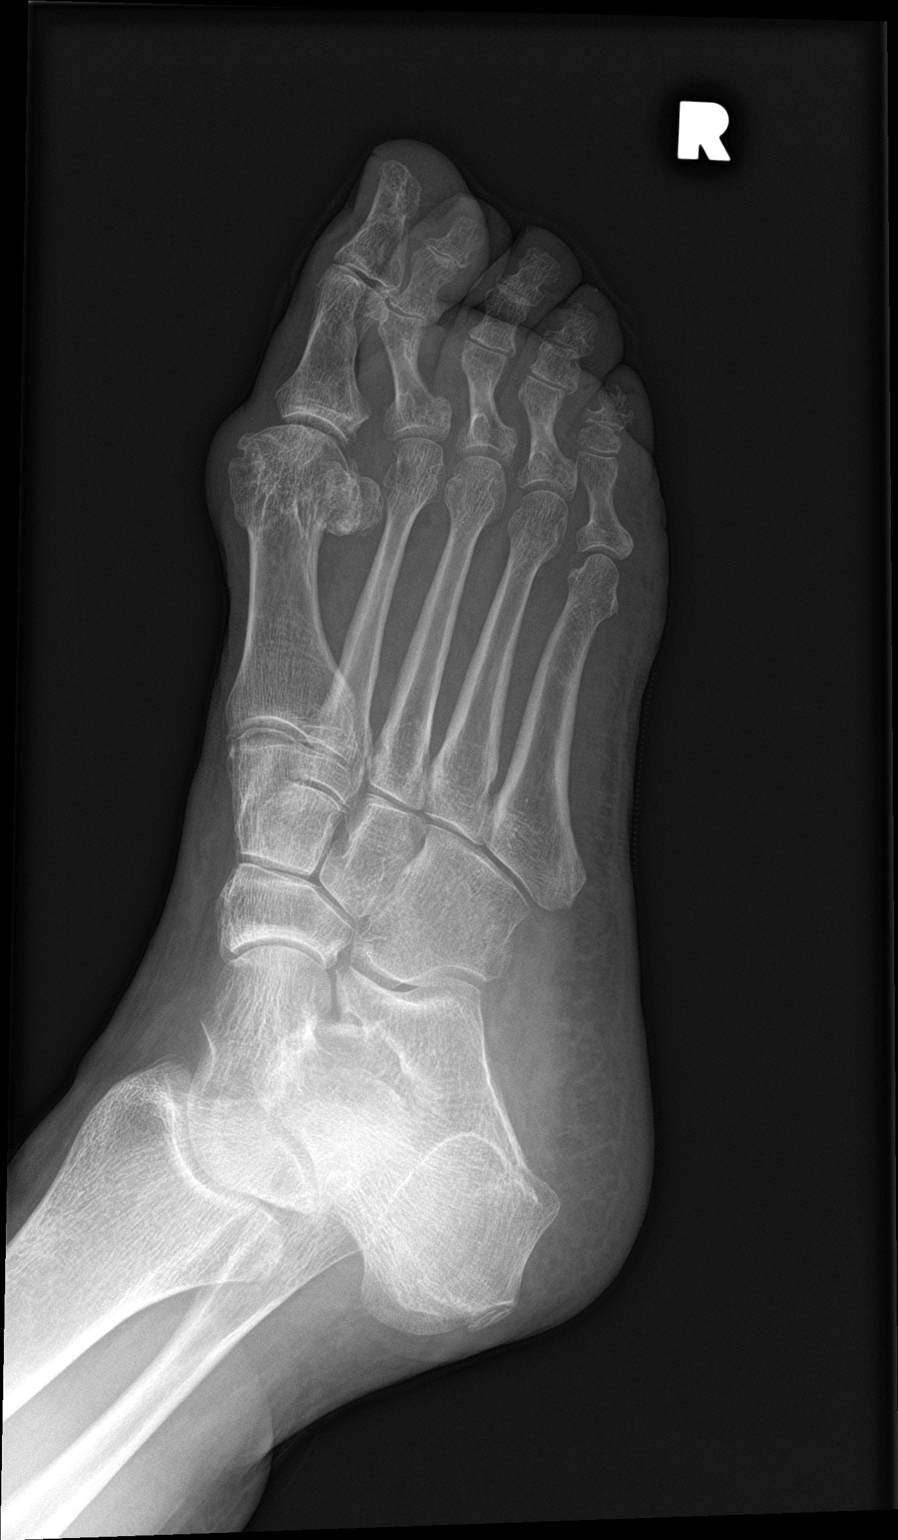

[foot lat]
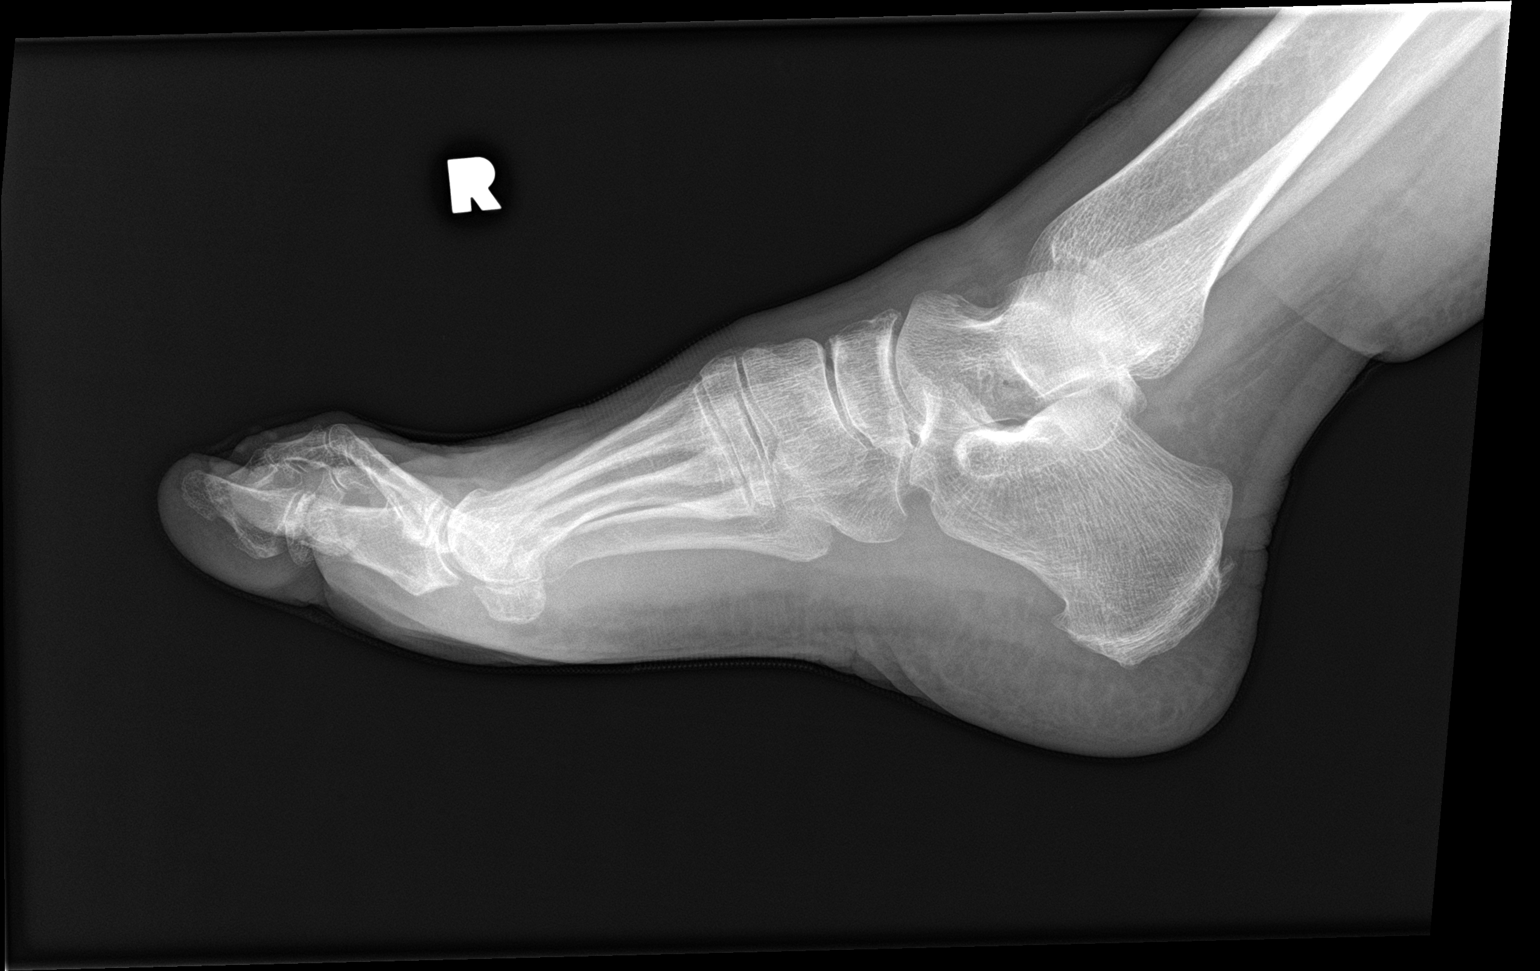

[3 of 3 positions shown; findings below may reference images not displayed]

FINDINGS: There is no acute fracture or dislocation. There is mild hallux
valgus. There are mild degenerative changes at the first
metatarsophalangeal joint with joint space narrowing, sclerosis and
osteophyte formation. Joint spaces are otherwise maintained. Soft
tissues are within normal limits.
IMPRESSION: 1. No acute bony abnormality.
2. Mild degenerative changes at the first metatarsophalangeal joint.
3. Hallux valgus.

## 2023-05-07 ENCOUNTER — Telehealth: Payer: Self-pay | Admitting: Family Medicine

## 2023-05-07 NOTE — Telephone Encounter (Signed)
Prescription Request  05/07/2023  LOV: 04/11/2023  What is the name of the medication or equipment? HYDROcodone-acetaminophen (NORCO/VICODIN) 5-325 MG tablet   Have you contacted your pharmacy to request a refill? Yes   Which pharmacy would you like this sent to?  [id number] - [place], [place] - [address] [place] [address] Phone: [phone] Fax: [phone]    Patient notified that their request is being sent to the clinical staff for review and that they should receive a response within 2 business days.   Please advise at Mobile [phone] (mobile)

## 2023-05-08 MED ORDER — HYDROCODONE-ACETAMINOPHEN 5-325 MG PO TABS: ORAL_TABLET | ORAL | 0 refills | Status: DC

## 2023-05-08 NOTE — Telephone Encounter (Signed)
Patient called her pharmacy to check the status of her prescription. I informed the patient of the policy and informed her Dr. Milinda Cave is out of office, but is checking messages periodically.

## 2023-05-08 NOTE — Telephone Encounter (Signed)
Noted. Medication has been sent to provider.

## 2023-05-08 NOTE — Telephone Encounter (Signed)
Hydrocodone rx sent.

## 2023-06-07 ENCOUNTER — Other Ambulatory Visit: Payer: Self-pay | Admitting: Family Medicine

## 2023-06-07 MED ORDER — HYDROCODONE-ACETAMINOPHEN 5-325 MG PO TABS: ORAL_TABLET | ORAL | 0 refills | Status: DC

## 2023-06-07 NOTE — Telephone Encounter (Signed)
Prescription Request  06/07/2023  LOV: 04/11/2023  What is the name of the medication or equipment? HYDROcodone-acetaminophen (NORCO/VICODIN) 5-325 MG tablet   Have you contacted your pharmacy to request a refill? No   Which pharmacy would you like this sent to?  [id number] - [place], [place] - [address] [place] [address] Phone: [phone] Fax: [phone]    Patient notified that their request is being sent to the clinical staff for review and that they should receive a response within 2 business days.   Please advise at Mobile [phone] (mobile)

## 2023-06-07 NOTE — Telephone Encounter (Signed)
noted 

## 2023-06-07 NOTE — Telephone Encounter (Signed)
Prescription sent

## 2023-06-07 NOTE — Telephone Encounter (Signed)
Pt is needing Rx sent to [facility] in [place]. [facility] they [facility] not [facility] and do not know when they will be getting.

## 2023-06-07 NOTE — Telephone Encounter (Signed)
Hydrocodone refill requested. Next OV 9/11

## 2023-06-11 ENCOUNTER — Other Ambulatory Visit: Payer: Self-pay

## 2023-06-11 NOTE — Telephone Encounter (Signed)
Rx re-sent on 06/07/23 to [facility] in [place]. Duplicate request for [facility]

## 2023-07-02 ENCOUNTER — Telehealth: Payer: Self-pay

## 2023-07-02 DIAGNOSIS — Z111 Encounter for screening for respiratory tuberculosis: Secondary | ICD-10-CM

## 2023-07-02 NOTE — Telephone Encounter (Signed)
Yes, anytime is fine with me

## 2023-07-02 NOTE — Telephone Encounter (Signed)
Pt called and wants to see if she can get TB blood testing done sometime this week for work. Please advise.

## 2023-07-03 NOTE — Telephone Encounter (Signed)
Pt contacted and scheduled for lab, future order placed.

## 2023-07-03 NOTE — Addendum Note (Signed)
Addended by: Emi Holes D on: 07/03/2023 09:38 AM   Modules accepted: Orders

## 2023-07-04 ENCOUNTER — Other Ambulatory Visit: Payer: Medicare Other

## 2023-07-04 DIAGNOSIS — Z111 Encounter for screening for respiratory tuberculosis: Secondary | ICD-10-CM

## 2023-07-05 ENCOUNTER — Encounter: Payer: Self-pay | Admitting: Family Medicine

## 2023-07-05 ENCOUNTER — Ambulatory Visit (INDEPENDENT_AMBULATORY_CARE_PROVIDER_SITE_OTHER): Payer: Medicare Other | Admitting: Family Medicine

## 2023-07-05 VITALS — BP 98/64 | HR 74 | Temp 97.9°F | Ht 60.0 in | Wt 136.2 lb

## 2023-07-05 DIAGNOSIS — U071 COVID-19: Secondary | ICD-10-CM

## 2023-07-05 DIAGNOSIS — R0981 Nasal congestion: Secondary | ICD-10-CM | POA: Diagnosis not present

## 2023-07-05 LAB — POC COVID19 BINAXNOW: SARS Coronavirus 2 Ag: POSITIVE — AB

## 2023-07-05 MED ORDER — NIRMATRELVIR/RITONAVIR (PAXLOVID) TABLET (RENAL DOSING)
2.0000 | ORAL_TABLET | Freq: Two times a day (BID) | ORAL | 0 refills | Status: AC
Start: 2023-07-05 — End: 2023-07-10

## 2023-07-05 NOTE — Patient Instructions (Addendum)
-  Rapid covid is positive.  -START Paxlovid, 2 tablets 2 times a day for 5 days. Please do not take your Atorvastatin for 14 days, starting today.  -Recommend supportive care: rest, hydrate, stay as active as you can at home with taking deep breaths often while awake.  -Alternate Tylenol 1000mg  and Ibuprofen 600-800mg  every 4 hours for pain, headache, and body aches. Recommend to eat something when taking Ibuprofen.   -You can also take Mucinex for symptoms.  -If you develop chest pain or your shortness of breath becomes worse, please go to the closes emergency department. -Recommend to add Zinc 50-100 mg for 30 days to help support your immune system.  -Discussed about covid quarantine guidelines.  -Follow up if not improved.

## 2023-07-05 NOTE — Progress Notes (Signed)
Acute Office Visit   Subjective:  Patient ID: Sheila Newman, female    DOB: 1946/05/12, 77 y.o.   MRN: [id number]  Chief Complaint  Patient presents with   Chills    CON   Nasal Congestion   Headache    Pt reports sx started yesterday. OTC-alka seltzer    HPI:  Patient is a 77 year old female that present with the following complaints:  +chills +nasal congestion +body aches, neck pain +dry cough +headache +ear pain with no drainage +SHOB on exertion and rest +Fever   -chest pain -nausea/vomiting   Symptoms started  2 days ago.  Denies being around anyone that she knows who had covid. Denies any recent travel.   OTC Nyquil, sinus medication, and Alka Seltzer with no relief.   ROS: See HPI above      Objective:   BP 98/64   Pulse 74   Temp 97.9 F (36.6 C)   Ht 5' (1.524 m)   Wt 136 lb 4 oz (61.8 kg)   SpO2 96%   BMI 26.61 kg/m    Physical Exam Vitals reviewed.  Constitutional:      General: She is not in acute distress.    Appearance: Normal appearance. She is ill-appearing. She is not toxic-appearing or diaphoretic.  HENT:     Head: Normocephalic and atraumatic.  Eyes:     General:        Right eye: No discharge.        Left eye: No discharge.     Conjunctiva/sclera: Conjunctivae normal.  Cardiovascular:     Rate and Rhythm: Normal rate and regular rhythm.     Heart sounds: Normal heart sounds. No murmur heard.    No friction rub. No gallop.  Pulmonary:     Effort: Pulmonary effort is normal. No respiratory distress.     Breath sounds: Normal breath sounds.  Musculoskeletal:        General: Normal range of motion.  Skin:    General: Skin is warm and dry.  Neurological:     General: No focal deficit present.     Mental Status: She is alert and oriented to person, place, and time. Mental status is at baseline.  Psychiatric:        Mood and Affect: Mood normal.        Behavior: Behavior normal.        Thought Content: Thought content  normal.        Judgment: Judgment normal.    Results for orders placed or performed in visit on 07/05/23  POC COVID-19  Result Value Ref Range   SARS Coronavirus 2 Ag Positive (A) Negative      Assessment & Plan:  COVID -     nirmatrelvir/ritonavir (renal dosing); Take 2 tablets by mouth 2 (two) times daily for 5 days. (Take nirmatrelvir 150 mg one tablet twice daily for 5 days and ritonavir 100 mg one tablet twice daily for 5 days) Patient GFR is 40.64  Dispense: 20 tablet; Refill: 0  Nasal congestion -     POC COVID-19 BinaxNow  -Rapid covid is positive.  -START Paxlovid, 2 tablets 2 times a day for 5 days. Advised to not take Atorvastatin for 14 days, starting today.  -Recommend supportive care: rest, hydrate, stay as active as she can at home with taking deep breaths often while awake.  -Alternate Tylenol 1000mg  and Ibuprofen 600-800mg  every 4 hours for pain, headache, and body aches. Recommend to eat  something when taking Ibuprofen.   -Advised she can also take Mucinex for symptoms.  -Advised if she develops chest pain or your shortness of breath becomes worse, please go to the closes emergency department. -Recommend to add Zinc 50-100 mg for 30 days to help support your immune system.  -Discussed about covid quarantine guidelines.  -Follow up if not improved.   Zandra Abts, NP

## 2023-07-06 LAB — QUANTIFERON-TB GOLD PLUS
Mitogen-NIL: >10 [IU]/mL
NIL: 0.08 [IU]/mL
QuantiFERON-TB Gold Plus: NEGATIVE
TB1-NIL: 0.02 [IU]/mL
TB2-NIL: 0.03 [IU]/mL

## 2023-07-10 ENCOUNTER — Telehealth: Payer: Self-pay

## 2023-07-10 MED ORDER — HYDROCODONE-ACETAMINOPHEN 5-325 MG PO TABS: ORAL_TABLET | ORAL | 0 refills | Status: DC

## 2023-07-10 NOTE — Telephone Encounter (Signed)
Pt made aware

## 2023-07-10 NOTE — Telephone Encounter (Signed)
Patient calling in regarding lab results from last week.  She says that she needs a letter because she is applying for a job.  Prescription Request  07/10/2023  LOV: Visit date not found  What is the name of the medication or equipment? HYDROcodone-acetaminophen (NORCO/VICODIN) 5-325 MG tablet   Have you contacted your pharmacy to request a refill? No   Which pharmacy would you like this sent to?  [id number] - [place], [place] - [address] [place] [address] Phone: [phone] Fax: [phone]  [id number] [place], [place] - [address] AT [facility] [address] [place] [place] [address] Phone: [phone] Fax: [phone]    Patient notified that their request is being sent to the clinical staff for review and that they should receive a response within 2 business days.   Please advise at Mobile [phone] (mobile)

## 2023-07-10 NOTE — Telephone Encounter (Signed)
Okay, hydrocodone prescription sent

## 2023-07-12 ENCOUNTER — Telehealth: Payer: Self-pay | Admitting: Family Medicine

## 2023-07-12 NOTE — Telephone Encounter (Signed)
Patient called to inquire about the result of her TB test. She had not received a call yet, however I informed her it was negative. She is needing documentation of the negative result for a job.  Please give the patient a call.

## 2023-07-17 NOTE — Patient Instructions (Signed)

## 2023-07-18 ENCOUNTER — Ambulatory Visit (INDEPENDENT_AMBULATORY_CARE_PROVIDER_SITE_OTHER): Payer: Medicare Other | Admitting: Family Medicine

## 2023-07-18 ENCOUNTER — Encounter: Payer: Self-pay | Admitting: Family Medicine

## 2023-07-18 VITALS — BP 123/65 | HR 63 | Temp 98.1°F | Wt 141.9 lb

## 2023-07-18 DIAGNOSIS — N3000 Acute cystitis without hematuria: Secondary | ICD-10-CM | POA: Diagnosis not present

## 2023-07-18 DIAGNOSIS — Z23 Encounter for immunization: Secondary | ICD-10-CM | POA: Diagnosis not present

## 2023-07-18 DIAGNOSIS — M542 Cervicalgia: Secondary | ICD-10-CM

## 2023-07-18 DIAGNOSIS — I10 Essential (primary) hypertension: Secondary | ICD-10-CM

## 2023-07-18 DIAGNOSIS — R300 Dysuria: Secondary | ICD-10-CM | POA: Diagnosis not present

## 2023-07-18 DIAGNOSIS — Z79899 Other long term (current) drug therapy: Secondary | ICD-10-CM | POA: Diagnosis not present

## 2023-07-18 DIAGNOSIS — G894 Chronic pain syndrome: Secondary | ICD-10-CM | POA: Diagnosis not present

## 2023-07-18 DIAGNOSIS — N1832 Chronic kidney disease, stage 3b: Secondary | ICD-10-CM | POA: Diagnosis not present

## 2023-07-18 DIAGNOSIS — G8929 Other chronic pain: Secondary | ICD-10-CM

## 2023-07-18 DIAGNOSIS — M545 Low back pain, unspecified: Secondary | ICD-10-CM

## 2023-07-18 LAB — POC URINALSYSI DIPSTICK (AUTOMATED)
Bilirubin, UA: NEGATIVE
Blood, UA: NEGATIVE
Glucose, UA: NEGATIVE
Ketones, UA: NEGATIVE
Nitrite, UA: NEGATIVE
Protein, UA: NEGATIVE
Spec Grav, UA: 1.015 (ref 1.010–1.025)
Urobilinogen, UA: 0.2 U/dL
pH, UA: 6 (ref 5.0–8.0)

## 2023-07-18 LAB — BASIC METABOLIC PANEL
BUN: 12 mg/dL (ref 6–23)
CO2: 29 meq/L (ref 19–32)
Calcium: 9.1 mg/dL (ref 8.4–10.5)
Chloride: 105 meq/L (ref 96–112)
Creatinine, Ser: 1.09 mg/dL (ref 0.40–1.20)
GFR: 49.65 mL/min — ABNORMAL LOW (ref >60.00)
Glucose, Bld: 66 mg/dL — ABNORMAL LOW (ref 70–99)
Potassium: 4.7 meq/L (ref 3.5–5.1)
Sodium: 140 meq/L (ref 135–145)

## 2023-07-18 MED ORDER — PANTOPRAZOLE SODIUM 40 MG PO TBEC: 40.0000 mg | DELAYED_RELEASE_TABLET | Freq: Every day | ORAL | 1 refills | Status: DC

## 2023-07-18 MED ORDER — SULFAMETHOXAZOLE-TRIMETHOPRIM 800-160 MG PO TABS: 1.0000 | ORAL_TABLET | Freq: Two times a day (BID) | ORAL | 0 refills | Status: DC

## 2023-07-18 MED ORDER — ATORVASTATIN CALCIUM 20 MG PO TABS: 20.0000 mg | ORAL_TABLET | Freq: Every day | ORAL | 1 refills | Status: DC

## 2023-07-18 MED ORDER — AMLODIPINE BESYLATE 10 MG PO TABS: 10.0000 mg | ORAL_TABLET | Freq: Every day | ORAL | 1 refills | Status: DC

## 2023-07-18 MED ORDER — TRAZODONE HCL 50 MG PO TABS: 50.0000 mg | ORAL_TABLET | Freq: Every day | ORAL | 1 refills | Status: DC

## 2023-07-18 MED ORDER — ALPRAZOLAM 1 MG PO TABS: 1.0000 mg | ORAL_TABLET | Freq: Three times a day (TID) | ORAL | 5 refills | Status: DC

## 2023-07-18 MED ORDER — METOPROLOL TARTRATE 25 MG PO TABS: 25.0000 mg | ORAL_TABLET | Freq: Two times a day (BID) | ORAL | 1 refills | Status: DC

## 2023-07-18 NOTE — Progress Notes (Signed)
OFFICE VISIT  07/18/2023  CC:  Chief Complaint  Patient presents with   Follow-up    3 month RCI. She has been having some burning with urination. Declined flu shot today.    Patient is a 77 y.o. female who presents for 49-month follow-up chronic pain syndrome, hypertension, and chronic renal insufficiency stage III. A/P as of last visit: "#1 hand osteoarthritis.  Most bothersome areas today are the right index MCP and right fifth digit DIP.   Reviewed with bedside ultrasound today and the DIP had no sufficient area large enough to inject today. Therefore we did proceed only with a steroid injection of the right index MCP joint.    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 Continue Vicodin 5-325, 1 twice daily as needed, number 60/month--> a new prescription was not needed today.   3.  Hypertension, well-controlled on amlodipine 10 mg a day and Lopressor 25 mg twice daily. Electrolytes and creatinine today.   4.  Hypercholesterolemia, doing well on atorvastatin 20 mg a day. Lipid panel today.   5.  Chronic renal insufficiency stage III. Avoiding NSAIDs. Electrolytes and creatinine monitoring today."  INTERIM HX: Has had about 10 days of gradually increasing burning with urination.  No urinary frequency or urgency.  No nausea or suprapubic pain. No fever.  Her chronic pain in her neck and back as well as into her shoulders hands and feet is all stable.  It does bother her all the time but she says she is "living with it". 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  Not NSAID candidate d/t CRI. Medication and dose: vicodin 5/325, 1 bid PMP AWARE reviewed today: most recent rx for Vicodin 5-325 was filled 07/11/2023, # 60, rx by me. No red flags.  ROS as above, plus--> no fevers, no CP, no SOB, no wheezing, no cough,  no dizziness, no HAs, no rashes, no melena/hematochezia.  No polyuria or polydipsia.    No focal weakness, paresthesias, or tremors.  No acute vision or hearing abnormalities.  No dysuria or unusual/new urinary urgency or frequency.  No recent changes in lower legs. No n/v/d or abd pain.  No palpitations.    Past Medical History:  Diagnosis Date   Anxiety and depression    Bilateral edema of lower extremity    Chronic pain syndrome    Dr. Laurian Brim   Chronic renal insufficiency, stage 3 (moderate) (HCC)    GFR 40s.  Renal u/s normal 06/2022   Frequent headaches    GERD (gastroesophageal reflux disease)    History of sepsis    UTI/admission   Hypercholesterolemia    Hypertension    Insomnia    Iron deficiency anemia    ?etiology.   Osteoarthritis, multiple sites    Feet, shoulders, back, hands    Past Surgical History:  Procedure Laterality Date   APPENDECTOMY  1986   Bunion and hammertoe surgery, right     10/27/2022 BUNION CORRECTION W/GREAT TOE FUSION, HAMMERTOE CORRECTION & METATARSAL SHORTENING 2ND, BONE GRAFTFROM HEEL   COLONOSCOPY  2018   normal (dig hea spec per pt report but that office has no records of her seeing them)   DEXA     01/2023 T score -2.3.  Rpt 2 yrs   ESOPHAGOGASTRODUODENOSCOPY     neg for bleeding ?eval    Outpatient Medications Prior to Visit  Medication Sig Dispense  Refill   albuterol (VENTOLIN HFA) 108 (90 Base) MCG/ACT inhaler Inhale 2 puffs into the lungs every 6 (six) hours as needed for wheezing or shortness of breath. 18 g 0   Ascorbic Acid (VITAMIN C PO) Take by mouth daily.     aspirin 81 MG chewable tablet Chew 81 mg by mouth daily in the afternoon.     Cholecalciferol (VITAMIN D3 PO) Take 50 mg by mouth daily.     Cyanocobalamin (VITAMIN B-12 PO) Take by mouth daily.     fluticasone (FLONASE) 50 MCG/ACT nasal spray INSTILL 1 SPRAY INTO BOTH NOSTRILS DAILY 48 mL 1   HYDROcodone-acetaminophen (NORCO/VICODIN) 5-325 MG tablet 1 tab po bid prn  pain 60 tablet 0   Iron (IRCON PO) Take 65 mg by mouth daily.     Omega-3 Fatty Acids (FISH OIL PO) Take by mouth daily.     VITAMIN E PO Take by mouth daily.     gabapentin (NEURONTIN) 100 MG capsule Take 1 capsule (100 mg total) by mouth in the morning, at noon, in the evening, and at bedtime. (Patient not taking: Reported on 07/05/2023) 360 capsule 1   ALPRAZolam (XANAX) 1 MG tablet Take 1 tablet (1 mg total) by mouth 3 (three) times daily. 90 tablet 5   amLODipine (NORVASC) 10 MG tablet Take 1 tablet (10 mg total) by mouth daily. 90 tablet 1   atorvastatin (LIPITOR) 20 MG tablet Take 1 tablet (20 mg total) by mouth daily. 90 tablet 1   metoprolol tartrate (LOPRESSOR) 25 MG tablet Take 1 tablet (25 mg total) by mouth 2 (two) times daily. 180 tablet 1   pantoprazole (PROTONIX) 40 MG tablet Take 1 tablet (40 mg total) by mouth daily. Take 1 tablet by mouth daily. 90 tablet 1   traZODone (DESYREL) 50 MG tablet Take 1 tablet (50 mg total) by mouth daily. 90 tablet 1   No facility-administered medications prior to visit.    Allergies  Allergen Reactions   Tramadol Itching    Review of Systems As per HPI  PE:    07/18/2023    9:48 AM 07/05/2023   10:36 AM 04/11/2023   10:20 AM  Vitals with BMI  Height  5\' 0"  5' 0.5"  Weight 141 lbs 14 oz 136 lbs 4 oz 139 lbs 13 oz  BMI 27.71 26.61 26.84  Systolic 123 98 104  Diastolic 65 64 59  Pulse 63 74 49     Physical Exam  Gen: Alert, well appearing.  Patient is oriented to person, place, time, and situation. AFFECT: pleasant, lucid thought and speech. No further exam today  LABS:  Last CBC Lab Results  Component Value Date   WBC 6.2 10/19/2021   HGB 11.8 (L) 10/19/2021   HCT 36.1 10/19/2021   MCV 88.1 10/19/2021   RDW 13.7 10/19/2021   PLT 248.0 10/19/2021   Lab Results  Component Value Date   IRON 60 10/19/2021   TIBC 252 10/19/2021   FERRITIN 53 10/19/2021    Last metabolic panel Lab Results  Component Value Date    GLUCOSE 66 (L) 04/11/2023   NA 141 04/11/2023   K 4.5 04/11/2023   CL 104 04/11/2023   CO2 26 04/11/2023   BUN 12 04/11/2023   CREATININE 1.29 (H) 04/11/2023   GFR 40.64 (L) 04/11/2023   CALCIUM 9.0 04/11/2023   PROT 6.1 04/11/2023   ALBUMIN 3.8 04/11/2023   BILITOT 0.4 04/11/2023   ALKPHOS 72 04/11/2023   AST 18 04/11/2023  ALT 11 04/11/2023   Last lipids Lab Results  Component Value Date   CHOL 165 04/11/2023   HDL 56.00 04/11/2023   LDLCALC 94 04/11/2023   TRIG 75.0 04/11/2023   CHOLHDL 3 04/11/2023   Last hemoglobin A1c Lab Results  Component Value Date   HGBA1C 5.4 10/04/2022   HGBA1C 5.4 10/04/2022   HGBA1C 5.4 (A) 10/04/2022   HGBA1C 5.4 10/04/2022   Last thyroid functions Lab Results  Component Value Date   TSH 2.10 03/01/2021   Last vitamin D Lab Results  Component Value Date   VD25OH 37.7 09/07/2022   IMPRESSION AND PLAN:  #1 UTI.  She has a history of urosepsis. UA today-->trace LEU, otherwise normal. Bactrim double strength 1 twice daily x 3 days prescribed. Urine sent for culture.  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 Continue Vicodin 5-325, 1 twice daily as needed, number 60/month--> a new prescription was not needed today.   3.  Hypertension, well-controlled on amlodipine 10 mg a day and Lopressor 25 mg twice daily. Electrolytes and creatinine today.   4.  Hypercholesterolemia, doing well on atorvastatin 20 mg a day. LDL was 94 about 3 months ago. Repeat lipid panel 3 months.   5.  Chronic renal insufficiency stage III. Avoiding NSAIDs. Electrolytes and creatinine monitoring today.  An After Visit Summary was printed and given to the patient.  FOLLOW UP: Return in about 3 months (around 10/17/2023) for annual CPE (fasting).  Signed:  Santiago Bumpers, MD           07/18/2023

## 2023-07-19 LAB — URINE CULTURE
MICRO NUMBER:: 15453608
Result:: NO GROWTH
SPECIMEN QUALITY:: ADEQUATE

## 2023-08-09 ENCOUNTER — Other Ambulatory Visit: Payer: Self-pay | Admitting: Family Medicine

## 2023-08-09 MED ORDER — HYDROCODONE-ACETAMINOPHEN 5-325 MG PO TABS: ORAL_TABLET | ORAL | 0 refills | Status: DC

## 2023-08-09 NOTE — Telephone Encounter (Signed)
Prescription Request  08/09/2023  LOV: 07/18/2023  What is the name of the medication or equipment?HYDROcodone-acetaminophen (NORCO/VICODIN) 5-325 MG tablet  [facility] in [place] is the correct pharmacy   Have you contacted your pharmacy to request a refill? Yes   Which pharmacy would you like this sent to?  [id number] [place], [place] - [address] [place] [address] Phone: [phone] Fax: [phone]  [id number] [place], [place] - [address] AT [facility] [address] [place] [address] Phone: [phone] Fax: [phone]    Patient notified that their request is being sent to the clinical staff for review and that they should receive a response within 2 business days.   Please advise at Mobile [phone] (mobile)

## 2023-08-09 NOTE — Telephone Encounter (Signed)
Requesting: HYDROcodone-acetaminophen (NORCO/VICODIN) 5-325 MG tablet  Contract: 10/04/22 UDS: 10/04/22 Last Visit: 07/18/23 Next Visit: 10/24/23 Last Refill: 07/10/23 (60,0)  Please Advise. Med pending

## 2023-09-13 ENCOUNTER — Telehealth: Payer: Self-pay

## 2023-09-13 MED ORDER — HYDROCODONE-ACETAMINOPHEN 5-325 MG PO TABS: ORAL_TABLET | ORAL | 0 refills | Status: DC

## 2023-09-13 NOTE — Telephone Encounter (Signed)
Prescription Request  09/13/2023  LOV: Visit date not found  What is the name of the medication or equipment? HYDROcodone-acetaminophen (NORCO/VICODIN) 5-325 MG tablet   Have you contacted your pharmacy to request a refill? No   Which pharmacy would you like this sent to?  [id number] [place], [place] - [address] [place] [address] Phone: [phone] Fax: [phone]  [facility][id number] [place], [place] - [address] AT [facility] [address] [place] [address] Phone: [phone] Fax: [phone]    Patient notified that their request is being sent to the clinical staff for review and that they should receive a response within 2 business days.   Please advise at Mobile [phone] (mobile)

## 2023-09-13 NOTE — Telephone Encounter (Signed)
Ok, rx sent

## 2023-09-25 ENCOUNTER — Telehealth: Payer: Self-pay | Admitting: Family Medicine

## 2023-09-25 NOTE — Telephone Encounter (Signed)
Hydrocodone refill requested. Next OV 12/18

## 2023-09-25 NOTE — Telephone Encounter (Signed)
Pt states she must have read her bottle wrong. She states she still has some of med left.

## 2023-09-25 NOTE — Telephone Encounter (Signed)
This is an early refill request (last rx 09/13/23). Pls ask pt if she has been taking more than usual. Thx.  I'll wait for answer before authorizing.

## 2023-09-25 NOTE — Telephone Encounter (Signed)
Prescription Request  09/25/2023  LOV: 07/18/2023  What is the name of the medication or equipment? HYDROcodone-acetaminophen (NORCO/VICODIN) 5-325 MG tablet   Have you contacted your pharmacy to request a refill? Yes   Which pharmacy would you like this sent to?  [id number] [place], [place] - [address] [place] [address] Phone: [phone] Fax: [phone]  [id number] [place], [place] - [address] AT [facility] [address] [address] [place] [address] Phone: [phone] Fax: [phone]    Patient notified that their request is being sent to the clinical staff for review and that they should receive a response within 2 business days.   Please advise at Mobile [phone] (mobile)

## 2023-10-20 ENCOUNTER — Other Ambulatory Visit: Payer: Self-pay | Admitting: Family Medicine

## 2023-10-23 ENCOUNTER — Telehealth: Payer: Self-pay

## 2023-10-23 MED ORDER — HYDROCODONE-ACETAMINOPHEN 5-325 MG PO TABS: ORAL_TABLET | ORAL | 0 refills | Status: DC

## 2023-10-23 NOTE — Telephone Encounter (Signed)
[facility] Next OV 12/18

## 2023-10-23 NOTE — Progress Notes (Deleted)
Office Note 10/23/2023  CC: No chief complaint on file.  Patient is a 77 y.o. female who is here for annual health maintenance exam and 3 mo f/u chronic pain syndrome, HTN, and CRI. A/P as of last visit: "1 UTI.  She has a history of urosepsis. UA today-->trace LEU, otherwise normal. Bactrim double strength 1 twice daily x 3 days prescribed. Urine sent for culture.   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 Continue Vicodin 5-325, 1 twice daily as needed, number 60/month--> a new prescription was not needed today.   3.  Hypertension, well-controlled on amlodipine 10 mg a day and Lopressor 25 mg twice daily. Electrolytes and creatinine today.   4.  Hypercholesterolemia, doing well on atorvastatin 20 mg a day. LDL was 94 about 3 months ago. Repeat lipid panel 3 months.   5.  Chronic renal insufficiency stage III. Avoiding NSAIDs. 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  Not NSAID candidate d/t CRI.  PMP AWARE reviewed today: most recent rx for alprazolam was filled 10/20/23, # 90, rx by me. Most recent rx for vicodin was filled 09/13/23, #60, rx by me. No red flags.   Past Medical History:  Diagnosis Date   Anxiety and depression    Bilateral edema of lower extremity    Chronic pain syndrome    Dr. Laurian Brim   Chronic renal insufficiency, stage 3 (moderate) (HCC)    GFR 40s.  Renal u/s normal 06/2022   Frequent headaches    GERD (gastroesophageal reflux disease)    History of sepsis    UTI/admission   Hypercholesterolemia    Hypertension    Insomnia    Iron deficiency anemia    ?etiology.   Osteoarthritis, multiple sites    Feet, shoulders, back, hands    Past Surgical History:  Procedure Laterality Date   APPENDECTOMY  1986    Bunion and hammertoe surgery, right     10/27/2022 BUNION CORRECTION W/GREAT TOE FUSION, HAMMERTOE CORRECTION & METATARSAL SHORTENING 2ND, BONE GRAFTFROM HEEL   COLONOSCOPY  2018   normal (dig hea spec per pt report but that office has no records of her seeing them)   DEXA     01/2023 T score -2.3.  Rpt 2 yrs   ESOPHAGOGASTRODUODENOSCOPY     neg for bleeding ?eval    Family History  Problem Relation Age of Onset   Arthritis Mother    Diabetes Mother    Heart attack Father    Diabetes Father    Arthritis Sister    COPD Sister    Diabetes Brother    Diabetes Brother    Diabetes Brother    Diabetes Brother    Diabetes Brother    Diabetes Brother     Social History   Socioeconomic History   Marital status: Divorced    Spouse name: Not on file   Number of children: Not on file   Years of education: Not on file   Highest education level: Not on file  Occupational History   Not on file  Tobacco Use   Smoking status: Never   Smokeless tobacco: Never  Substance and Sexual Activity   Alcohol use: Never   Drug use: Never   Sexual activity: Not on file  Other Topics Concern   Not on file  Social History Narrative  Widowed-->reMarried, 2 biologic, 2 adopted.   Educ: HS.  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   No T/A/Ds.   Social Drivers of Corporate investment banker Strain: Low Risk  (02/07/2023)   Overall Financial Resource Strain (CARDIA)    Difficulty of Paying Living Expenses: Not hard at all  Food Insecurity: No Food Insecurity (02/20/2023)   Hunger Vital Sign    Worried About Running Out of Food in the Last Year: Never true    Ran Out of Food in the Last Year: Never true  Recent Concern: Food Insecurity - Food Insecurity Present (02/16/2023)   Received from [facility], [facility]   Hunger Vital Sign    Worried About Running Out of Food in the Last Year: Sometimes true    Ran Out of Food in the Last Year:  Sometimes true  Transportation Needs: No Transportation Needs (02/20/2023)   PRAPARE - Administrator, Civil Service (Medical): No    Lack of Transportation (Non-Medical): No  Physical Activity: Inactive (02/07/2023)   Exercise Vital Sign    Days of Exercise per Week: 0 days    Minutes of Exercise per Session: 0 min  Stress: Stress Concern Present (02/16/2023)   Received from [facility], [facility] Occupational Health - Occupational Stress Questionnaire    Feeling of Stress : Rather much  Social Connections: Moderately Isolated (02/07/2023)   Social Connection and Isolation Panel [NHANES]    Frequency of Communication with Friends and Family: Once a week    Frequency of Social Gatherings with Friends and Family: More than three times a week    Attends Religious Services: More than 4 times per year    Active Member of [employer] or Organizations: No    Attends Banker Meetings: Never    Marital Status: Divorced  Catering manager Violence: Not At Risk (02/15/2023)   Received from [employer], [facility]   HITS    Over the last 12 months how often did your partner physically hurt you?: Never    Over the last 12 months how often did your partner insult you or talk down to you?: Never    Over the last 12 months how often did your partner threaten you with physical harm?: Never    Over the last 12 months how often did your partner scream or curse at you?: Never    Outpatient Medications Prior to Visit  Medication Sig Dispense Refill   albuterol (VENTOLIN HFA) 108 (90 Base) MCG/ACT inhaler Inhale 2 puffs into the lungs every 6 (six) hours as needed for wheezing or shortness of breath. 18 g 0   ALPRAZolam (XANAX) 1 MG tablet Take 1 tablet (1 mg total) by mouth 3 (three) times daily. 90 tablet 5   amLODipine (NORVASC) 10 MG tablet Take 1 tablet (10 mg total) by mouth daily. 90 tablet 1   Ascorbic Acid (VITAMIN C PO) Take by mouth daily.      aspirin 81 MG chewable tablet Chew 81 mg by mouth daily in the afternoon.     atorvastatin (LIPITOR) 20 MG tablet Take 1 tablet (20 mg total) by mouth daily. 90 tablet 1   Cholecalciferol (VITAMIN D3 PO) Take 50 mg by mouth daily.     Cyanocobalamin (VITAMIN B-12 PO) Take by mouth daily.     fluticasone (FLONASE) 50 MCG/ACT nasal spray INSTILL 1 SPRAY INTO [facility]  NOSTRIL DAILY 48 mL 1   gabapentin (NEURONTIN) 100 MG capsule Take 1 capsule (100 mg total) by mouth in the morning, at noon, in the evening, and at bedtime. (Patient not taking: Reported on 07/05/2023) 360 capsule 1   HYDROcodone-acetaminophen (NORCO/VICODIN) 5-325 MG tablet 1 tab po bid prn pain 60 tablet 0   Iron (IRCON PO) Take 65 mg by mouth daily.     metoprolol tartrate (LOPRESSOR) 25 MG tablet Take 1 tablet (25 mg total) by mouth 2 (two) times daily. 180 tablet 1   Omega-3 Fatty Acids (FISH OIL PO) Take by mouth daily.     pantoprazole (PROTONIX) 40 MG tablet Take 1 tablet (40 mg total) by mouth daily. Take 1 tablet by mouth daily. 90 tablet 1   sulfamethoxazole-trimethoprim (BACTRIM DS) 800-160 MG tablet Take 1 tablet by mouth 2 (two) times daily. 6 tablet 0   traZODone (DESYREL) 50 MG tablet Take 1 tablet (50 mg total) by mouth daily. 90 tablet 1   VITAMIN E PO Take by mouth daily.     No facility-administered medications prior to visit.    Allergies  Allergen Reactions   Tramadol Itching    Review of Systems *** PE;    07/18/2023    9:48 AM 07/05/2023   10:36 AM 04/11/2023   10:20 AM  Vitals with BMI  Height  5\' 0"  5' 0.5"  Weight 141 lbs 14 oz 136 lbs 4 oz 139 lbs 13 oz  BMI 27.71 26.61 26.84  Systolic 123 98 104  Diastolic 65 64 59  Pulse 63 74 49     *** Pertinent labs:  Lab Results  Component Value Date   TSH 2.10 03/01/2021   Lab Results  Component Value Date   WBC 6.2 10/19/2021   HGB 11.8 (L) 10/19/2021   HCT 36.1 10/19/2021   MCV 88.1 10/19/2021   PLT 248.0 10/19/2021   Lab Results   Component Value Date   IRON 60 10/19/2021   TIBC 252 10/19/2021   FERRITIN 53 10/19/2021   Lab Results  Component Value Date   CREATININE 1.09 07/18/2023   BUN 12 07/18/2023   NA 140 07/18/2023   K 4.7 07/18/2023   CL 105 07/18/2023   CO2 29 07/18/2023   Lab Results  Component Value Date   ALT 11 04/11/2023   AST 18 04/11/2023   ALKPHOS 72 04/11/2023   BILITOT 0.4 04/11/2023   Lab Results  Component Value Date   CHOL 165 04/11/2023   Lab Results  Component Value Date   HDL 56.00 04/11/2023   Lab Results  Component Value Date   LDLCALC 94 04/11/2023   Lab Results  Component Value Date   TRIG 75.0 04/11/2023   Lab Results  Component Value Date   CHOLHDL 3 04/11/2023   Lab Results  Component Value Date   HGBA1C 5.4 10/04/2022   HGBA1C 5.4 10/04/2022   HGBA1C 5.4 (A) 10/04/2022   HGBA1C 5.4 10/04/2022   ASSESSMENT AND PLAN:   No problem-specific Assessment & Plan notes found for this encounter.  Health maintenance exam: Reviewed age and gender appropriate health maintenance issues (prudent diet, regular exercise, health risks of tobacco and excessive alcohol, use of seatbelts, fire alarms in home, use of sunscreen).  Also reviewed age and gender appropriate health screening as well as vaccine recommendations. Vaccines: Prevnar 20-->***.  Otherwise UTD. Labs: cbc,cmet,flp Cervical ca screening: no further screening is indicated. Breast ca screening: mammogram ordered 12/2022-->***. Colon ca screening: pt recalls  colonoscopy approx 2017, normal per her report, no records available. Discussed options today--->***.  An After Visit Summary was printed and given to the patient.  FOLLOW UP:  No follow-ups on file.  Signed:  Santiago Bumpers, MD           10/23/2023

## 2023-10-23 NOTE — Telephone Encounter (Signed)
Prescription Request  10/23/2023  LOV: Visit date not found  What is the name of the medication or equipment? HYDROcodone-acetaminophen (NORCO/VICODIN) 5-325 MG tablet   Have you contacted your pharmacy to request a refill? No   Which pharmacy would you like this sent to?  [id number] [place] [place] - [address] [place] [address] Phone: [phone] Fax: [phone]  [facility][id number] [place], [place] - [address] AT [facility] [address] [place] [address] [place] [address] Phone: [phone] Fax: [phone]    Patient notified that their request is being sent to the clinical staff for review and that they should receive a response within 2 business days.   Please advise at Mobile [phone] (mobile)

## 2023-10-23 NOTE — Telephone Encounter (Signed)
Okay, prescription sent 

## 2023-10-24 ENCOUNTER — Encounter: Payer: Medicare Other | Admitting: Family Medicine

## 2023-10-24 DIAGNOSIS — M48062 Spinal stenosis, lumbar region with neurogenic claudication: Secondary | ICD-10-CM

## 2023-10-24 DIAGNOSIS — G8929 Other chronic pain: Secondary | ICD-10-CM

## 2023-10-24 DIAGNOSIS — Z79899 Other long term (current) drug therapy: Secondary | ICD-10-CM

## 2023-10-24 DIAGNOSIS — N183 Chronic kidney disease, stage 3 unspecified: Secondary | ICD-10-CM

## 2023-10-24 DIAGNOSIS — G894 Chronic pain syndrome: Secondary | ICD-10-CM

## 2023-10-24 DIAGNOSIS — Z0000 Encounter for general adult medical examination without abnormal findings: Secondary | ICD-10-CM

## 2023-10-24 DIAGNOSIS — E7800 Pure hypercholesterolemia, unspecified: Secondary | ICD-10-CM

## 2023-10-24 DIAGNOSIS — I10 Essential (primary) hypertension: Secondary | ICD-10-CM

## 2023-10-30 ENCOUNTER — Encounter: Payer: Self-pay | Admitting: Family Medicine

## 2023-10-30 ENCOUNTER — Ambulatory Visit (INDEPENDENT_AMBULATORY_CARE_PROVIDER_SITE_OTHER): Payer: Medicare Other | Admitting: Family Medicine

## 2023-10-30 VITALS — BP 125/74 | HR 70 | Ht 61.5 in | Wt 138.8 lb

## 2023-10-30 DIAGNOSIS — G894 Chronic pain syndrome: Secondary | ICD-10-CM | POA: Diagnosis not present

## 2023-10-30 DIAGNOSIS — E7800 Pure hypercholesterolemia, unspecified: Secondary | ICD-10-CM | POA: Diagnosis not present

## 2023-10-30 DIAGNOSIS — R0989 Other specified symptoms and signs involving the circulatory and respiratory systems: Secondary | ICD-10-CM

## 2023-10-30 DIAGNOSIS — Z79899 Other long term (current) drug therapy: Secondary | ICD-10-CM | POA: Diagnosis not present

## 2023-10-30 DIAGNOSIS — N1831 Chronic kidney disease, stage 3a: Secondary | ICD-10-CM | POA: Diagnosis not present

## 2023-10-30 DIAGNOSIS — I10 Essential (primary) hypertension: Secondary | ICD-10-CM

## 2023-10-30 DIAGNOSIS — M48062 Spinal stenosis, lumbar region with neurogenic claudication: Secondary | ICD-10-CM

## 2023-10-30 DIAGNOSIS — Z0000 Encounter for general adult medical examination without abnormal findings: Secondary | ICD-10-CM

## 2023-10-30 DIAGNOSIS — F411 Generalized anxiety disorder: Secondary | ICD-10-CM

## 2023-10-30 DIAGNOSIS — Z1231 Encounter for screening mammogram for malignant neoplasm of breast: Secondary | ICD-10-CM

## 2023-10-30 LAB — LIPID PANEL
Cholesterol: 154 mg/dL (ref 0–200)
HDL: 52.5 mg/dL (ref >39.00)
LDL Cholesterol: 83 mg/dL (ref 0–99)
NonHDL: 101.17
Total CHOL/HDL Ratio: 3
Triglycerides: 89 mg/dL (ref 0.0–149.0)
VLDL: 17.8 mg/dL (ref 0.0–40.0)

## 2023-10-30 LAB — CBC
HCT: 36.8 % (ref 36.0–46.0)
Hemoglobin: 12 g/dL (ref 12.0–15.0)
MCHC: 32.5 g/dL (ref 30.0–36.0)
MCV: 90 fL (ref 78.0–100.0)
Platelets: 251 10*3/uL (ref 150.0–400.0)
RBC: 4.09 Mil/uL (ref 3.87–5.11)
RDW: 13.2 % (ref 11.5–15.5)
WBC: 5.1 10*3/uL (ref 4.0–10.5)

## 2023-10-30 LAB — COMPREHENSIVE METABOLIC PANEL
ALT: 14 U/L (ref 0–35)
AST: 19 U/L (ref 0–37)
Albumin: 3.9 g/dL (ref 3.5–5.2)
Alkaline Phosphatase: 62 U/L (ref 39–117)
BUN: 14 mg/dL (ref 6–23)
CO2: 28 meq/L (ref 19–32)
Calcium: 8.8 mg/dL (ref 8.4–10.5)
Chloride: 104 meq/L (ref 96–112)
Creatinine, Ser: 1.06 mg/dL (ref 0.40–1.20)
GFR: 51.24 mL/min — ABNORMAL LOW (ref >60.00)
Glucose, Bld: 64 mg/dL — ABNORMAL LOW (ref 70–99)
Potassium: 3.7 meq/L (ref 3.5–5.1)
Sodium: 141 meq/L (ref 135–145)
Total Bilirubin: 0.6 mg/dL (ref 0.2–1.2)
Total Protein: 6.3 g/dL (ref 6.0–8.3)

## 2023-10-30 NOTE — Progress Notes (Signed)
Office Note 10/30/2023  CC:  Chief Complaint  Patient presents with   Annual Exam    Pt is not fasting.    Patient is a 77 y.o. female who is here for annual health maintenance exam and 26-month follow-up chronic pain syndrome, hypertension, and chronic renal insufficiency. A/P as of last visit: "1 UTI.  She has a history of urosepsis. UA today-->trace LEU, otherwise normal. Bactrim double strength 1 twice daily x 3 days prescribed. Urine sent for culture.   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 Continue Vicodin 5-325, 1 twice daily as needed, number 60/month--> a new prescription was not needed today.   3.  Hypertension, well-controlled on amlodipine 10 mg a day and Lopressor 25 mg twice daily. Electrolytes and creatinine today.   4.  Hypercholesterolemia, doing well on atorvastatin 20 mg a day. LDL was 94 about 3 months ago. Repeat lipid panel 3 months.   5.  Chronic renal insufficiency stage III. Avoiding NSAIDs. Electrolytes and creatinine monitoring today."  INTERIM HX: Feeling well. She has a job now at [employer] in [employer], Conservation officer, nature. Back/neck pain unchanged whether she works or not.  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  Not NSAID candidate d/t CRI. Medication and dose: vicodin 5/325, 1 bid PMP AWARE reviewed today: most recent rx for Vicodin 5/325 was filled 10/23/2023, # 60, rx by me. Most recent alprazolam 1 mg prescription filled 10/20/2023, #90, prescription by me. No red flags.  Past Medical History:  Diagnosis Date   Anxiety and depression    Bilateral edema of lower extremity    Chronic pain syndrome    Dr. Laurian Brim   Chronic renal insufficiency, stage 3 (moderate) (HCC)    GFR 40s.  Renal u/s normal 06/2022   Frequent headaches    GERD (gastroesophageal reflux  disease)    History of sepsis    UTI/admission   Hypercholesterolemia    Hypertension    Insomnia    Iron deficiency anemia    ?etiology.   Osteoarthritis, multiple sites    Feet, shoulders, back, hands    Past Surgical History:  Procedure Laterality Date   APPENDECTOMY  1986   Bunion and hammertoe surgery, right     10/27/2022 BUNION CORRECTION W/GREAT TOE FUSION, HAMMERTOE CORRECTION & METATARSAL SHORTENING 2ND, BONE GRAFTFROM HEEL   COLONOSCOPY  2018   normal (dig hea spec per pt report but that office has no records of her seeing them)   DEXA     01/2023 T score -2.3.  Rpt 2 yrs   ESOPHAGOGASTRODUODENOSCOPY     neg for bleeding ?eval    Family History  Problem Relation Age of Onset   Arthritis Mother    Diabetes Mother    Heart attack Father    Diabetes Father    Arthritis Sister    COPD Sister    Diabetes Brother    Diabetes Brother    Diabetes Brother    Diabetes Brother    Diabetes Brother    Diabetes Brother     Social History   Socioeconomic History   Marital status: Divorced    Spouse name: Not on file   Number of children: Not on file   Years of education: Not on file   Highest education level: Not on file  Occupational History   Not on file  Tobacco Use   Smoking  status: Never   Smokeless tobacco: Never  Substance and Sexual Activity   Alcohol use: Never   Drug use: Never   Sexual activity: Not on file  Other Topics Concern   Not on file  Social History Narrative   Widowed-->reMarried, 2 biologic, 2 adopted.   Educ: HS.  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   No T/A/Ds.   Social Drivers of Corporate investment banker Strain: Low Risk  (02/07/2023)   Overall Financial Resource Strain (CARDIA)    Difficulty of Paying Living Expenses: Not hard at all  Food Insecurity: No Food Insecurity (02/20/2023)   Hunger Vital Sign    Worried About Running Out of Food in the Last Year: Never true    Ran  Out of Food in the Last Year: Never true  Recent Concern: Food Insecurity - Food Insecurity Present (02/16/2023)   Received from [facility], [facility]   Hunger Vital Sign    Worried About Running Out of Food in the Last Year: Sometimes true    Ran Out of Food in the Last Year: Sometimes true  Transportation Needs: No Transportation Needs (02/20/2023)   PRAPARE - Administrator, Civil Service (Medical): No    Lack of Transportation (Non-Medical): No  Physical Activity: Inactive (02/07/2023)   Exercise Vital Sign    Days of Exercise per Week: 0 days    Minutes of Exercise per Session: 0 min  Stress: Stress Concern Present (02/16/2023)   Received from [facility], [facility] of Occupational Health - Occupational Stress Questionnaire    Feeling of Stress : Rather much  Social Connections: Moderately Isolated (02/07/2023)   Social Connection and Isolation Panel [NHANES]    Frequency of Communication with Friends and Family: Once a week    Frequency of Social Gatherings with Friends and Family: More than three times a week    Attends Religious Services: More than 4 times per year    Active Member of [employer] or Organizations: No    Attends Banker Meetings: Never    Marital Status: Divorced  Catering manager Violence: Not At Risk (02/15/2023)   Received from [facility], [facility]   HITS    Over the last 12 months how often did your partner physically hurt you?: Never    Over the last 12 months how often did your partner insult you or talk down to you?: Never    Over the last 12 months how often did your partner threaten you with physical harm?: Never    Over the last 12 months how often did your partner scream or curse at you?: Never    Outpatient Medications Prior to Visit  Medication Sig Dispense Refill   albuterol (VENTOLIN HFA) 108 (90 Base) MCG/ACT inhaler Inhale 2 puffs into the lungs every 6 (six) hours as needed for wheezing  or shortness of breath. 18 g 0   ALPRAZolam (XANAX) 1 MG tablet Take 1 tablet (1 mg total) by mouth 3 (three) times daily. 90 tablet 5   amLODipine (NORVASC) 10 MG tablet Take 1 tablet (10 mg total) by mouth daily. 90 tablet 1   Ascorbic Acid (VITAMIN C PO) Take by mouth daily.     aspirin 81 MG chewable tablet Chew 81 mg by mouth daily in the afternoon.     atorvastatin (LIPITOR) 20 MG tablet Take 1 tablet (20 mg total) by  mouth daily. 90 tablet 1   Cholecalciferol (VITAMIN D3 PO) Take 50 mg by mouth daily.     Cyanocobalamin (VITAMIN B-12 PO) Take by mouth daily.     fluticasone (FLONASE) 50 MCG/ACT nasal spray INSTILL 1 SPRAY INTO EACH NOSTRIL DAILY 48 mL 1   HYDROcodone-acetaminophen (NORCO/VICODIN) 5-325 MG tablet 1 tab po bid prn pain 60 tablet 0   Iron (IRCON PO) Take 65 mg by mouth daily.     metoprolol tartrate (LOPRESSOR) 25 MG tablet Take 1 tablet (25 mg total) by mouth 2 (two) times daily. 180 tablet 1   Omega-3 Fatty Acids (FISH OIL PO) Take by mouth daily.     pantoprazole (PROTONIX) 40 MG tablet Take 1 tablet (40 mg total) by mouth daily. Take 1 tablet by mouth daily. 90 tablet 1   traZODone (DESYREL) 50 MG tablet Take 1 tablet (50 mg total) by mouth daily. 90 tablet 1   VITAMIN E PO Take by mouth daily.     sulfamethoxazole-trimethoprim (BACTRIM DS) 800-160 MG tablet Take 1 tablet by mouth 2 (two) times daily. 6 tablet 0   gabapentin (NEURONTIN) 100 MG capsule Take 1 capsule (100 mg total) by mouth in the morning, at noon, in the evening, and at bedtime. (Patient not taking: Reported on 10/30/2023) 360 capsule 1   No facility-administered medications prior to visit.    Allergies  Allergen Reactions   Tramadol Itching    Review of Systems  Constitutional:  Negative for appetite change, chills, fatigue and fever.  HENT:  Negative for congestion, dental problem, ear pain and sore throat.   Eyes:  Negative for discharge, redness and visual disturbance.  Respiratory:   Negative for cough, chest tightness, shortness of breath and wheezing.   Cardiovascular:  Negative for chest pain, palpitations and leg swelling.  Gastrointestinal:  Negative for abdominal pain, blood in stool, diarrhea, nausea and vomiting.  Genitourinary:  Negative for difficulty urinating, dysuria, flank pain, frequency, hematuria and urgency.  Musculoskeletal:  Positive for back pain, neck pain and neck stiffness. Negative for arthralgias, joint swelling and myalgias.  Skin:  Negative for pallor and rash.  Neurological:  Negative for dizziness, speech difficulty, weakness and headaches.  Hematological:  Negative for adenopathy. Does not bruise/bleed easily.  Psychiatric/Behavioral:  Negative for confusion and sleep disturbance. The patient is not nervous/anxious.     PE;    10/30/2023    9:33 AM 07/18/2023    9:48 AM 07/05/2023   10:36 AM  Vitals with BMI  Height 5' 1.5"  5\' 0"   Weight 138 lbs 13 oz 141 lbs 14 oz 136 lbs 4 oz  BMI 25.8 27.71 26.61  Systolic 125 123 98  Diastolic 74 65 64  Pulse 70 63 74    Exam chaperoned by Cloe Motsinger, CMA Gen: Alert, well appearing.  Patient is oriented to person, place, time, and situation. AFFECT: pleasant, lucid thought and speech. ENT: Ears: EACs clear, normal epithelium.  TMs with good light reflex and landmarks bilaterally.  Eyes: no injection, icteris, swelling, or exudate.  EOMI, PERRLA. Nose: no drainage or turbinate edema/swelling.  No injection or focal lesion.  Mouth: lips without lesion/swelling.  Oral mucosa pink and moist.  Dentition intact and without obvious caries or gingival swelling.  Oropharynx without erythema, exudate, or swelling.  Neck: supple/nontender.  No LAD, mass, or TM.  Carotid pulses 2+ bilaterally, without bruits. CV: RRR, no m/r/g.   LUNGS: CTA bilat, nonlabored resps, good aeration in all lung fields. ABD:  soft, NT, ND, BS normal.  No hepatospenomegaly or mass.   +Prominent aortic pulsation, +bruit. EXT:  no clubbing, cyanosis, or edema.  Musculoskeletal: no joint swelling, erythema, warmth, or tenderness.  ROM of all joints intact. Skin - no sores or suspicious lesions or rashes or color changes  Pertinent labs:  Lab Results  Component Value Date   TSH 2.10 03/01/2021   Lab Results  Component Value Date   WBC 6.2 10/19/2021   HGB 11.8 (L) 10/19/2021   HCT 36.1 10/19/2021   MCV 88.1 10/19/2021   PLT 248.0 10/19/2021   Lab Results  Component Value Date   CREATININE 1.09 07/18/2023   BUN 12 07/18/2023   NA 140 07/18/2023   K 4.7 07/18/2023   CL 105 07/18/2023   CO2 29 07/18/2023   Lab Results  Component Value Date   ALT 11 04/11/2023   AST 18 04/11/2023   ALKPHOS 72 04/11/2023   BILITOT 0.4 04/11/2023   Lab Results  Component Value Date   CHOL 165 04/11/2023   Lab Results  Component Value Date   HDL 56.00 04/11/2023   Lab Results  Component Value Date   LDLCALC 94 04/11/2023   Lab Results  Component Value Date   TRIG 75.0 04/11/2023   Lab Results  Component Value Date   CHOLHDL 3 04/11/2023   Lab Results  Component Value Date   HGBA1C 5.4 10/04/2022   HGBA1C 5.4 10/04/2022   HGBA1C 5.4 (A) 10/04/2022   HGBA1C 5.4 10/04/2022   ASSESSMENT AND PLAN:   Health maintenance exam: Reviewed age and gender appropriate health maintenance issues (prudent diet, regular exercise, health risks of tobacco and excessive alcohol, use of seatbelts, fire alarms in home, use of sunscreen).  Also reviewed age and gender appropriate health screening as well as vaccine recommendations. Vaccines: ALL UTD Labs: BC, c-Met, lipid panel Cervical ca screening: No further screening due to age Breast ca screening: Screening mammogram will be done (MOBILE) April 2025. Colon ca screening: Negative colonoscopy in 2018 per patient report.  No records.  Pt will consider repeat colonoscopy or stool-based test in 2028.  Osteoporosis screening: DEXA 01/2023 T-score -2.3.  Plan repeat around  01/2025.  2. 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 Continue Vicodin 5-325, 1 twice daily as needed, number 60/month. CSC UTD. UDS today.   3.  Hypertension, well-controlled on amlodipine 10 mg a day and Lopressor 25 mg twice daily. Electrolytes and creatinine today.   4.  Hypercholesterolemia, doing well on atorvastatin 20 mg a day. LDL was 94 about 6 months ago. Repeat lipid panel today.   5.  Chronic renal insufficiency stage III. Avoiding NSAIDs. Electrolytes and creatinine monitoring today.  An After Visit Summary was printed and given to the patient.  FOLLOW UP:  Return in about 3 months (around 01/28/2024) for routine chronic illness f/u.  Signed:  Santiago Bumpers, MD           10/30/2023

## 2023-10-30 NOTE — Patient Instructions (Signed)

## 2023-11-02 ENCOUNTER — Ambulatory Visit: Payer: Medicare Other

## 2023-11-02 DIAGNOSIS — R0989 Other specified symptoms and signs involving the circulatory and respiratory systems: Secondary | ICD-10-CM

## 2023-11-02 LAB — DRUG MONITORING PANEL 376104, URINE
Alphahydroxyalprazolam: 825 ng/mL — ABNORMAL HIGH (ref <25)
Alphahydroxymidazolam: NEGATIVE ng/mL (ref <50)
Alphahydroxytriazolam: NEGATIVE ng/mL (ref <50)
Aminoclonazepam: NEGATIVE ng/mL (ref <25)
Amphetamines: NEGATIVE ng/mL (ref <500)
Barbiturates: NEGATIVE ng/mL (ref <300)
Benzodiazepines: POSITIVE ng/mL — AB (ref <100)
Cocaine Metabolite: NEGATIVE ng/mL (ref <150)
Codeine: NEGATIVE ng/mL (ref <50)
Desmethyltramadol: NEGATIVE ng/mL (ref <100)
Hydrocodone: 248 ng/mL — ABNORMAL HIGH (ref <50)
Hydromorphone: 141 ng/mL — ABNORMAL HIGH (ref <50)
Hydroxyethylflurazepam: NEGATIVE ng/mL (ref <50)
Lorazepam: NEGATIVE ng/mL (ref <50)
Morphine: NEGATIVE ng/mL (ref <50)
Nordiazepam: NEGATIVE ng/mL (ref <50)
Norhydrocodone: 345 ng/mL — ABNORMAL HIGH (ref <50)
Opiates: POSITIVE ng/mL — AB (ref <100)
Oxazepam: NEGATIVE ng/mL (ref <50)
Oxycodone: NEGATIVE ng/mL (ref <100)
Temazepam: NEGATIVE ng/mL (ref <50)
Tramadol: NEGATIVE ng/mL (ref <100)

## 2023-11-02 LAB — DM TEMPLATE

## 2023-11-04 ENCOUNTER — Encounter: Payer: Self-pay | Admitting: Family Medicine

## 2023-11-05 ENCOUNTER — Telehealth: Payer: Self-pay

## 2023-11-05 NOTE — Telephone Encounter (Signed)
Reason for CRM: Patient called in after missed call from clinic. Patient had multiple messages left to review labs and to also review recent imaging results. Successfully went over normal lab results. Patient needs call back to review imaging results. Callback [phone]   Pt was called back and was given results.

## 2024-01-18 ENCOUNTER — Other Ambulatory Visit: Payer: Self-pay | Admitting: Family Medicine

## 2024-01-18 NOTE — Telephone Encounter (Signed)
 Requesting: ALPRAZolam (XANAX) 1 MG tablet  Contract: 10/04/22 UDS: 10/30/23 Last Visit: 10/30/23 Next Visit: 01/29/24 Last Refill: 07/1123 (90,5)  Please Advise. Med pending

## 2024-01-23 ENCOUNTER — Ambulatory Visit (INDEPENDENT_AMBULATORY_CARE_PROVIDER_SITE_OTHER): Payer: Medicare Other | Admitting: *Deleted

## 2024-01-23 DIAGNOSIS — Z0000 Encounter for general adult medical examination without abnormal findings: Secondary | ICD-10-CM

## 2024-01-23 NOTE — Progress Notes (Signed)
 Subjective:   Sheila Newman is a 78 y.o. female who presents for Medicare Annual (Subsequent) preventive examination.  Visit Complete: Virtual I connected with  Sheila Newman on 01/23/24 by a audio enabled telemedicine application and verified that I am speaking with the correct person using two identifiers.  Patient Location: Home  Provider Location: Home Office  I discussed the limitations of evaluation and management by telemedicine. The patient expressed understanding and agreed to proceed.  Vital Signs: Because this visit was a virtual/telehealth visit, some criteria may be missing or patient reported. Any vitals not documented were not able to be obtained and vitals that have been documented are patient reported.  Cardiac Risk Factors include: advanced age (>10men, >77 women);hypertension     Objective:    Today's Vitals   01/23/24 0813  PainSc: 8    There is no height or weight on file to calculate BMI.     01/23/2024    8:14 AM 02/07/2023    8:36 AM 02/01/2022   10:08 AM  Advanced Directives  Does Patient Have a Medical Advance Directive? No Yes Yes  Type of Special educational needs teacher of [place];Living will Healthcare Power of Attorney  Copy of Healthcare Power of Attorney in Chart?  No - copy requested No - copy requested  Would patient like information on creating a medical advance directive? No - Patient declined      Current Medications (verified) Outpatient Encounter Medications as of 01/23/2024  Medication Sig   albuterol (VENTOLIN HFA) 108 (90 Base) MCG/ACT inhaler Inhale 2 puffs into the lungs every 6 (six) hours as needed for wheezing or shortness of breath.   ALPRAZolam (XANAX) 1 MG tablet TAKE 1 TABLET BY MOUTH 3 TIMES DAILY.   amLODipine (NORVASC) 10 MG tablet Take 1 tablet (10 mg total) by mouth daily.   Ascorbic Acid (VITAMIN C PO) Take by mouth daily.   aspirin 81 MG chewable tablet Chew 81 mg by mouth daily in the afternoon.    atorvastatin (LIPITOR) 20 MG tablet Take 1 tablet (20 mg total) by mouth daily.   Cholecalciferol (VITAMIN D3 PO) Take 50 mg by mouth daily.   Cyanocobalamin (VITAMIN B-12 PO) Take by mouth daily.   fluticasone (FLONASE) 50 MCG/ACT nasal spray INSTILL 1 SPRAY INTO EACH NOSTRIL DAILY   Iron (IRCON PO) Take 65 mg by mouth daily.   metoprolol tartrate (LOPRESSOR) 25 MG tablet Take 1 tablet (25 mg total) by mouth 2 (two) times daily.   Omega-3 Fatty Acids (FISH OIL PO) Take by mouth daily.   pantoprazole (PROTONIX) 40 MG tablet Take 1 tablet (40 mg total) by mouth daily. Take 1 tablet by mouth daily.   traZODone (DESYREL) 50 MG tablet Take 1 tablet (50 mg total) by mouth daily.   VITAMIN E PO Take by mouth daily.   gabapentin (NEURONTIN) 100 MG capsule Take 1 capsule (100 mg total) by mouth in the morning, at noon, in the evening, and at bedtime. (Patient not taking: Reported on 01/23/2024)   HYDROcodone-acetaminophen (NORCO/VICODIN) 5-325 MG tablet 1 tab po bid prn pain (Patient not taking: Reported on 01/23/2024)   No facility-administered encounter medications on file as of 01/23/2024.    Allergies (verified) Tramadol   History: Past Medical History:  Diagnosis Date   Abdominal bruit    normal aortic ultrasound 10/2023   Anxiety and depression    Bilateral edema of lower extremity    Chronic pain syndrome    Dr. Laurian Brim  Chronic renal insufficiency, stage 3 (moderate) (HCC)    GFR 40s.  Renal u/s normal 06/2022   Frequent headaches    GERD (gastroesophageal reflux disease)    History of sepsis    UTI/admission   Hypercholesterolemia    Hypertension    Insomnia    Iron deficiency anemia    ?etiology.   Osteoarthritis, multiple sites    Feet, shoulders, back, hands   Past Surgical History:  Procedure Laterality Date   Aortic ultrasound     11/02/23 NORMAL   APPENDECTOMY  1986   Bunion and hammertoe surgery, right     10/27/2022 BUNION CORRECTION W/GREAT TOE FUSION, HAMMERTOE  CORRECTION & METATARSAL SHORTENING 2ND, BONE GRAFTFROM HEEL   COLONOSCOPY  2018   normal (dig [facility]a spec per pt report but that office has no records of her seeing them)   DEXA     01/2023 T score -2.3.  Rpt 2 yrs   ESOPHAGOGASTRODUODENOSCOPY     neg for bleeding ?eval   Family History  Problem Relation Age of Onset   Arthritis Mother    Diabetes Mother    Heart attack Father    Diabetes Father    Arthritis Sister    COPD Sister    Diabetes Brother    Diabetes Brother    Diabetes Brother    Diabetes Brother    Diabetes Brother    Diabetes Brother    Social History   Socioeconomic History   Marital status: Divorced    Spouse name: Not on file   Number of children: Not on file   Years of education: Not on file   Highest education level: Not on file  Occupational History   Not on file  Tobacco Use   Smoking status: Never   Smokeless tobacco: Never  Substance and Sexual Activity   Alcohol use: Never   Drug use: Never   Sexual activity: Not on file  Other Topics Concern   Not on file  Social History Narrative   Widowed-->reMarried, 2 biologic, 2 adopted.   Educ: HS.  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   No T/A/Ds.   Social Drivers of Corporate investment banker Strain: Low Risk  (01/23/2024)   Overall Financial Resource Strain (CARDIA)    Difficulty of Paying Living Expenses: Not very hard  Food Insecurity: Food Insecurity Present (01/23/2024)   Hunger Vital Sign    Worried About Running Out of Food in the Last Year: Sometimes true    Ran Out of Food in the Last Year: Sometimes true  Transportation Needs: No Transportation Needs (01/23/2024)   PRAPARE - Administrator, Civil Service (Medical): No    Lack of Transportation (Non-Medical): No  Physical Activity: Inactive (01/23/2024)   Exercise Vital Sign    Days of Exercise per Week: 0 days    Minutes of Exercise per Session: 0 min  Stress: Stress Concern  Present (01/23/2024)   [employer] of Occupational Health - Occupational Stress Questionnaire    Feeling of Stress : Rather much  Social Connections: Moderately Integrated (01/23/2024)   Social Connection and Isolation Panel [NHANES]    Frequency of Communication with Friends and Family: Twice a week    Frequency of Social Gatherings with Friends and Family: Three times a week    Attends Religious Services: More than 4 times per year    Active Member of Clubs or Organizations: Yes  Attends Banker Meetings: More than 4 times per year    Marital Status: Widowed    Tobacco Counseling Counseling given: Not Answered   Clinical Intake:  Pre-visit preparation completed: Yes  Pain : 0-10 Pain Score: 8  Pain Type: Chronic pain Pain Location: Back Pain Descriptors / Indicators: Burning, Constant, Aching, Dull Pain Onset: More than a month ago Pain Frequency: Constant     Diabetes: No  How often do you need to have someone help you when you read instructions, pamphlets, or other written materials from your doctor or pharmacy?: 1 - Never  Interpreter Needed?: No  Information entered by :: Remi Haggard LPN   Activities of Daily Living    01/23/2024    8:14 AM 02/07/2023    8:39 AM  In your present state of health, do you have any difficulty performing the following activities:  Hearing? 0 0  Vision? 0 0  Difficulty concentrating or making decisions? 0 0  Walking or climbing stairs? 0 0  Dressing or bathing? 0 0  Doing errands, shopping? 0 0  Preparing Food and eating ? N N  Using the Toilet? N N  In the past six months, have you accidently leaked urine? N Y  Comment  at times  Do you have problems with loss of bowel control? N N  Managing your Medications? N N  Managing your Finances? N N  Housekeeping or managing your Housekeeping? N N    Patient Care Team: Jeoffrey Massed, MD as PCP - General (Family Medicine) Michaelle Copas, MD as Referring  Physician (Optometry) Mabe, Tim, DMD (Dentistry) Peggye Pitt, MD Edwin Cap, DPM as Consulting Physician (Podiatry)  Indicate any recent Medical Services you may have received from other than [facility] providers in the past year (date may be approximate).     Assessment:   This is a routine wellness examination for [place].  Hearing/Vision screen Hearing Screening - Comments:: No trouble hearing Vision Screening - Comments:: [facility] Up to date   Goals Addressed             This Visit's Progress    Patient Stated       Continue current lifestyle       Depression Screen    01/23/2024    8:21 AM 07/18/2023    9:52 AM 07/05/2023   10:36 AM 04/11/2023   10:28 AM 02/23/2023   11:23 AM 02/15/2023    4:08 PM 02/07/2023    8:37 AM  PHQ 2/9 Scores  PHQ - 2 Score 6 2 2 2 2 2  0  PHQ- 9 Score 12 8 10 8 9 8      Fall Risk    01/23/2024    8:14 AM 07/18/2023    9:52 AM 07/05/2023   10:36 AM 04/11/2023   10:26 AM 02/23/2023   11:22 AM  Fall Risk   Falls in the past year? 0 1 1 1 1   Number falls in past yr: 0 0 0 1 1  Injury with Fall? 0 1 1 1 1   Risk for fall due to :  History of fall(s) No Fall Risks No Fall Risks No Fall Risks  Follow up Falls evaluation completed;Education provided;Falls prevention discussed Falls evaluation completed Falls evaluation completed Falls evaluation completed Falls evaluation completed    MEDICARE RISK AT HOME: Medicare Risk at Home Any stairs in or around the home?: No If so, are there any without handrails?: No Home free of loose  throw rugs in walkways, pet beds, electrical cords, etc?: Yes Adequate lighting in your home to reduce risk of falls?: Yes Life alert?: No Use of a cane, walker or w/c?: No Grab bars in the bathroom?: No Shower chair or bench in shower?: No Elevated toilet seat or a handicapped toilet?: No  TIMED UP AND GO:  Was the test performed?  No    Cognitive Function:        01/23/2024    8:18 AM 02/07/2023    8:40  AM 02/01/2022   10:10 AM  6CIT Screen  What Year? 0 points 0 points 0 points  What month? 0 points 0 points 0 points  What time? 0 points 0 points 0 points  Count back from 20 0 points 0 points 2 points  Months in reverse 0 points 0 points 0 points  Repeat phrase 0 points 0 points 4 points  Total Score 0 points 0 points 6 points    Immunizations Immunization History  Administered Date(s) Administered   Fluad Quad(high Dose 65+) 09/09/2018, 09/15/2020, 08/03/2022   Influenza Split 08/31/2015, 08/07/2016   Influenza, High Dose Seasonal PF 06/28/2019, 07/02/2019   Influenza, Seasonal, Injecte, Preservative Fre 10/11/2015   Influenza-Unspecified 09/03/2012, 08/19/2013, 09/02/2014, 09/04/2021   PFIZER(Purple Top)SARS-COV-2 Vaccination 01/15/2020, 02/05/2020, 10/08/2020   Pfizer Covid-19 Vaccine Bivalent Booster 72yrs & up 09/04/2021   Pfizer(Comirnaty)Fall Seasonal Vaccine 12 years and older 08/03/2022   Pneumococcal Conjugate-13 10/07/2014   Pneumococcal Polysaccharide-23 08/09/2010   Tdap 02/11/2015   Zoster Recombinant(Shingrix) 10/04/2017, 01/10/2023    TDAP status: Up to date  Flu Vaccine status: Up to date  Pneumococcal vaccine status: Due, Education has been provided regarding the importance of this vaccine. Advised may receive this vaccine at local pharmacy or Health Dept. Aware to provide a copy of the vaccination record if obtained from local pharmacy or Health Dept. Verbalized acceptance and understanding.  Covid-19 vaccine status: Information provided on how to obtain vaccines.   Qualifies for Shingles Vaccine? No   Zostavax completed Yes   Shingrix Completed?: Yes  Screening Tests Health Maintenance  Topic Date Due   Hepatitis C Screening  Never done   Pneumonia Vaccine 21+ Years old (3 of 3 - PPSV23 or PCV20) 08/10/2015   COVID-19 Vaccine (6 - 2024-25 season) 07/08/2023   Medicare Annual Wellness (AWV)  01/22/2025   DTaP/Tdap/Td (2 - Td or Tdap) 02/10/2025    INFLUENZA VACCINE  Completed   DEXA SCAN  Completed   Zoster Vaccines- Shingrix  Completed   HPV VACCINES  Aged Out   Colonoscopy  Discontinued    Health Maintenance  Health Maintenance Due  Topic Date Due   Hepatitis C Screening  Never done   Pneumonia Vaccine 45+ Years old (3 of 3 - PPSV23 or PCV20) 08/10/2015   COVID-19 Vaccine (6 - 2024-25 season) 07/08/2023    Colorectal cancer screening: No longer required.   Mammogram status: Completed  . Repeat every year  Bone Density status: Completed 2024. Results reflect: Bone density results: OSTEOPOROSIS. Repeat every 2 years.  Lung Cancer Screening: (Low Dose CT Chest recommended if Age 60-80 years, 20 pack-year currently smoking OR have quit w/in 15years.) does not qualify.   Lung Cancer Screening Referral:   Additional Screening:  Hepatitis C Screening: does qualify  never done  Vision Screening: Recommended annual ophthalmology exams for early detection of glaucoma and other disorders of the eye. Is the patient up to date with their annual eye exam?  No  Who is  the provider or what is the name of the office in which the patient attends annual eye exams? [facility] If pt is not established with a provider, would they like to be referred to a provider to establish care? No .   Dental Screening: Recommended annual dental exams for proper oral hygiene    Community Resource Referral / Chronic Care Management: CRR required this visit?  No   CCM required this visit?  No     Plan:     I have personally reviewed and noted the following in the patient's chart:   Medical and social history Use of alcohol, tobacco or illicit drugs  Current medications and supplements including opioid prescriptions. Patient is not currently taking opioid prescriptions. Functional ability and status Nutritional status Physical activity Advanced directives List of other physicians Hospitalizations, surgeries, and ER visits in  previous 12 months Vitals Screenings to include cognitive, depression, and falls Referrals and appointments  In addition, I have reviewed and discussed with patient certain preventive protocols, quality metrics, and best practice recommendations. A written personalized care plan for preventive services as well as general preventive health recommendations were provided to patient.     Remi Haggard, LPN   1/61/0960   After Visit Summary: (MyChart) Due to this being a telephonic visit, the after visit summary with patients personalized plan was offered to patient via MyChart   Nurse Notes:   Patient stated her insurance does not cover her Hydrocodone , she has been without and her pain level is at an 8 all day.    She is scheduled for an appointment on 01-29-2024  to discuss option.

## 2024-01-23 NOTE — Patient Instructions (Signed)
 Sheila Newman , Thank you for taking time to come for your Medicare Wellness Visit. I appreciate your ongoing commitment to your health goals. Please review the following plan we discussed and let me know if I can assist you in the future.   Screening recommendations/referrals: Colonoscopy: no longer required Mammogram: up to date Bone Density: up to date Recommended yearly ophthalmology/optometry visit for glaucoma screening and checkup Recommended yearly dental visit for hygiene and checkup  Vaccinations: Influenza vaccine: up to date Pneumococcal vaccine: Tdap vaccine: up to date Shingles vaccine: up to date         Preventive Care 65 Years and Older, Female Preventive care refers to lifestyle choices and visits with your health care provider that can promote health and wellness. What does preventive care include? A yearly physical exam. This is also called an annual well check. Dental exams once or twice a year. Routine eye exams. Ask your health care provider how often you should have your eyes checked. Personal lifestyle choices, including: Daily care of your teeth and gums. Regular physical activity. Eating a healthy diet. Avoiding tobacco and drug use. Limiting alcohol use. Practicing safe sex. Taking low-dose aspirin every day. Taking vitamin and mineral supplements as recommended by your health care provider. What happens during an annual well check? The services and screenings done by your health care provider during your annual well check will depend on your age, overall health, lifestyle risk factors, and family history of disease. Counseling  Your health care provider may ask you questions about your: Alcohol use. Tobacco use. Drug use. Emotional well-being. Home and relationship well-being. Sexual activity. Eating habits. History of falls. Memory and ability to understand (cognition). Work and work Astronomer. Reproductive health. Screening  You may  have the following tests or measurements: Height, weight, and BMI. Blood pressure. Lipid and cholesterol levels. These may be checked every 5 years, or more frequently if you are over 29 years old. Skin check. Lung cancer screening. You may have this screening every year starting at age 76 if you have a 30-pack-year history of smoking and currently smoke or have quit within the past 15 years. Fecal occult blood test (FOBT) of the stool. You may have this test every year starting at age 76. Flexible sigmoidoscopy or colonoscopy. You may have a sigmoidoscopy every 5 years or a colonoscopy every 10 years starting at age 76. Hepatitis C blood test. Hepatitis B blood test. Sexually transmitted disease (STD) testing. Diabetes screening. This is done by checking your blood sugar (glucose) after you have not eaten for a while (fasting). You may have this done every 1-3 years. Bone density scan. This is done to screen for osteoporosis. You may have this done starting at age 76. Mammogram. This may be done every 1-2 years. Talk to your health care provider about how often you should have regular mammograms. Talk with your health care provider about your test results, treatment options, and if necessary, the need for more tests. Vaccines  Your health care provider may recommend certain vaccines, such as: Influenza vaccine. This is recommended every year. Tetanus, diphtheria, and acellular pertussis (Tdap, Td) vaccine. You may need a Td booster every 76 years. Zoster vaccine. You may need this after age 76. Pneumococcal 13-valent conjugate (PCV13) vaccine. One dose is recommended after age 76. Pneumococcal polysaccharide (PPSV23) vaccine. One dose is recommended after age 76. Talk to your health care provider about which screenings and vaccines you need and how often you need them. This  information is not intended to replace advice given to you by your health care provider. Make sure you discuss any  questions you have with your health care provider. Document Released: 11/19/2015 Document Revised: 07/12/2016 Document Reviewed: 08/24/2015 Elsevier Interactive Patient Education  2017 [employer].  Fall Prevention in the Home Falls can cause injuries. They can happen to people of all ages. There are many things you can do to make your home safe and to help prevent falls. What can I do on the outside of my home? Regularly fix the edges of walkways and driveways and fix any cracks. Remove anything that might make you trip as you walk through a door, such as a raised step or threshold. Trim any bushes or trees on the path to your home. Use bright outdoor lighting. Clear any walking paths of anything that might make someone trip, such as rocks or tools. Regularly check to see if handrails are loose or broken. Make sure that both sides of any steps have handrails. Any raised decks and porches should have guardrails on the edges. Have any leaves, snow, or ice cleared regularly. Use sand or salt on walking paths during winter. Clean up any spills in your garage right away. This includes oil or grease spills. What can I do in the bathroom? Use night lights. Install grab bars by the toilet and in the tub and shower. Do not use towel bars as grab bars. Use non-skid mats or decals in the tub or shower. If you need to sit down in the shower, use a plastic, non-slip stool. Keep the floor dry. Clean up any water that spills on the floor as soon as it happens. Remove soap buildup in the tub or shower regularly. Attach bath mats securely with double-sided non-slip rug tape. Do not have throw rugs and other things on the floor that can make you trip. What can I do in the bedroom? Use night lights. Make sure that you have a light by your bed that is easy to reach. Do not use any sheets or blankets that are too big for your bed. They should not hang down onto the floor. Have a firm chair that has side  arms. You can use this for support while you get dressed. Do not have throw rugs and other things on the floor that can make you trip. What can I do in the kitchen? Clean up any spills right away. Avoid walking on wet floors. Keep items that you use a lot in easy-to-reach places. If you need to reach something above you, use a strong step stool that has a grab bar. Keep electrical cords out of the way. Do not use floor polish or wax that makes floors slippery. If you must use wax, use non-skid floor wax. Do not have throw rugs and other things on the floor that can make you trip. What can I do with my stairs? Do not leave any items on the stairs. Make sure that there are handrails on both sides of the stairs and use them. Fix handrails that are broken or loose. Make sure that handrails are as long as the stairways. Check any carpeting to make sure that it is firmly attached to the stairs. Fix any carpet that is loose or worn. Avoid having throw rugs at the top or bottom of the stairs. If you do have throw rugs, attach them to the floor with carpet tape. Make sure that you have a light switch at the top  of the stairs and the bottom of the stairs. If you do not have them, ask someone to add them for you. What else can I do to help prevent falls? Wear shoes that: Do not have high heels. Have rubber bottoms. Are comfortable and fit you well. Are closed at the toe. Do not wear sandals. If you use a stepladder: Make sure that it is fully opened. Do not climb a closed stepladder. Make sure that both sides of the stepladder are locked into place. Ask someone to hold it for you, if possible. Clearly mark and make sure that you can see: Any grab bars or handrails. First and last steps. Where the edge of each step is. Use tools that help you move around (mobility aids) if they are needed. These include: Canes. Walkers. Scooters. Crutches. Turn on the lights when you go into a dark area.  Replace any light bulbs as soon as they burn out. Set up your furniture so you have a clear path. Avoid moving your furniture around. If any of your floors are uneven, fix them. If there are any pets around you, be aware of where they are. Review your medicines with your doctor. Some medicines can make you feel dizzy. This can increase your chance of falling. Ask your doctor what other things that you can do to help prevent falls. This information is not intended to replace advice given to you by your health care provider. Make sure you discuss any questions you have with your health care provider. Document Released: 08/19/2009 Document Revised: 03/30/2016 Document Reviewed: 11/27/2014 Elsevier Interactive Patient Education  2017 [employer].

## 2024-01-25 NOTE — Patient Instructions (Addendum)
 Ask your insurer if oxycodone, oxycodone combined with tylenol, or hydrocodone combined with tylenol.  Ask if these are covered at any particular strength tablet. Call and let my nurse know what you find out.  Take 1 capful miralax every day.

## 2024-01-28 NOTE — Progress Notes (Unsigned)
 OFFICE VISIT  01/29/2024  CC:  Chief Complaint  Patient presents with   Hypertension    Patient is a 78 y.o. female who presents for 3 mo f/u chronic pain syndrome, HTN, and CRI III. 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 Continue Vicodin 5-325, 1 twice daily as needed, number 60/month. CSC UTD. UDS today.   2.  Hypertension, well-controlled on amlodipine 10 mg a day and Lopressor 25 mg twice daily. Electrolytes and creatinine today.   3.  Hypercholesterolemia, doing well on atorvastatin 20 mg a day. LDL was 94 about 6 months ago. Repeat lipid panel today.   4.  Chronic renal insufficiency stage III. Avoiding NSAIDs. Electrolytes and creatinine monitoring today."  INTERIM HX: Her pain in her back, shoulders, knees, hands, and feet is more prominent lately because she has not been able to afford her Vicodin.  Has chronic problem with infrequent and hard bowel movements.  She does not take any over-the-counter medication for this.  She does not drink adequate water during her day. She does drink some coffee and Coca-Cola.  She has poor concentration, sleep dysfunction, depressed mood, and chronic nervousness.  She is troubled because of 2 adult children in her home that will not get a job or move out or do anything for themselves.  They will not help her pay any bills.  She has crying spells.  Denies SI or HI.  PMP AWARE reviewed today: most recent rx for alprazolam was filled 01/18/24, # 90, rx by me. Most recent rx for vicodin was filled 10/23/23, #60, rx by me. No red flags.  ROS as above, plus--> no fevers, no CP, no SOB, no wheezing, no cough, no dizziness, no HAs, no rashes, no melena/hematochezia.  No polyuria or polydipsia. No focal weakness, paresthesias, or tremors.  No acute vision or hearing abnormalities.  No dysuria or unusual/new urinary urgency or  frequency.  No recent changes in lower legs. No n/v/d or abd pain.  No palpitations.    Past Medical History:  Diagnosis Date   Abdominal bruit    normal aortic ultrasound 10/2023   Anxiety and depression    Bilateral edema of lower extremity    Chronic pain syndrome    Dr. Laurian Brim   Chronic renal insufficiency, stage 3 (moderate) (HCC)    GFR 40s.  Renal u/s normal 06/2022   Frequent headaches    GERD (gastroesophageal reflux disease)    History of sepsis    UTI/admission   Hypercholesterolemia    Hypertension    Insomnia    Iron deficiency anemia    ?etiology.   Osteoarthritis, multiple sites    Feet, shoulders, back, hands    Past Surgical History:  Procedure Laterality Date   Aortic ultrasound     11/02/23 NORMAL   APPENDECTOMY  1986   Bunion and hammertoe surgery, right     10/27/2022 BUNION CORRECTION W/GREAT TOE FUSION, HAMMERTOE CORRECTION & METATARSAL SHORTENING 2ND, BONE GRAFTFROM HEEL   COLONOSCOPY  2018   normal ([facility] per pt report but that office has no records of her seeing them)   DEXA     01/2023 T score -2.3.  Rpt 2 yrs   ESOPHAGOGASTRODUODENOSCOPY     neg for bleeding ?eval    Outpatient Medications Prior to Visit  Medication Sig Dispense Refill   albuterol (VENTOLIN HFA) 108 (90 Base) MCG/ACT inhaler Inhale 2 puffs  into the lungs every 6 (six) hours as needed for wheezing or shortness of breath. 18 g 0   ALPRAZolam (XANAX) 1 MG tablet TAKE 1 TABLET BY MOUTH 3 TIMES DAILY. 90 tablet 5   amLODipine (NORVASC) 10 MG tablet Take 1 tablet (10 mg total) by mouth daily. 90 tablet 1   Ascorbic Acid (VITAMIN C PO) Take by mouth daily.     aspirin 81 MG chewable tablet Chew 81 mg by mouth daily in the afternoon.     atorvastatin (LIPITOR) 20 MG tablet Take 1 tablet (20 mg total) by mouth daily. 90 tablet 1   Cholecalciferol (VITAMIN D3 PO) Take 50 mg by mouth daily.     Cyanocobalamin (VITAMIN B-12 PO) Take by mouth daily.     fluticasone (FLONASE) 50  MCG/ACT nasal spray INSTILL 1 SPRAY INTO EACH NOSTRIL DAILY 48 mL 1   Iron (IRCON PO) Take 65 mg by mouth daily.     metoprolol tartrate (LOPRESSOR) 25 MG tablet Take 1 tablet (25 mg total) by mouth 2 (two) times daily. 180 tablet 1   Omega-3 Fatty Acids (FISH OIL PO) Take by mouth daily.     pantoprazole (PROTONIX) 40 MG tablet Take 1 tablet (40 mg total) by mouth daily. Take 1 tablet by mouth daily. 90 tablet 1   traZODone (DESYREL) 50 MG tablet Take 1 tablet (50 mg total) by mouth daily. 90 tablet 1   VITAMIN E PO Take by mouth daily.     gabapentin (NEURONTIN) 100 MG capsule Take 1 capsule (100 mg total) by mouth in the morning, at noon, in the evening, and at bedtime. (Patient not taking: Reported on 01/29/2024) 360 capsule 1   HYDROcodone-acetaminophen (NORCO/VICODIN) 5-325 MG tablet 1 tab po bid prn pain (Patient not taking: Reported on 01/29/2024) 60 tablet 0   No facility-administered medications prior to visit.    Allergies  Allergen Reactions   Tramadol Itching    Review of Systems As per HPI  PE:    01/29/2024    8:03 AM 10/30/2023    9:33 AM 07/18/2023    9:48 AM  Vitals with BMI  Height  5' 1.5"   Weight 141 lbs 13 oz 138 lbs 13 oz 141 lbs 14 oz  BMI  25.8 27.71  Systolic 138 125 563  Diastolic 75 74 65  Pulse 55 70 63     Physical Exam  Gen: Alert, well appearing.  Patient is oriented to person, place, time, and situation. AFFECT: pleasant, lucid thought and speech. No further exam today  LABS:  Last CBC Lab Results  Component Value Date   WBC 5.1 10/30/2023   HGB 12.0 10/30/2023   HCT 36.8 10/30/2023   MCV 90.0 10/30/2023   RDW 13.2 10/30/2023   PLT 251.0 10/30/2023   Last metabolic panel Lab Results  Component Value Date   GLUCOSE 64 (L) 10/30/2023   NA 141 10/30/2023   K 3.7 10/30/2023   CL 104 10/30/2023   CO2 28 10/30/2023   BUN 14 10/30/2023   CREATININE 1.06 10/30/2023   GFR 51.24 (L) 10/30/2023   CALCIUM 8.8 10/30/2023   PROT 6.3  10/30/2023   ALBUMIN 3.9 10/30/2023   BILITOT 0.6 10/30/2023   ALKPHOS 62 10/30/2023   AST 19 10/30/2023   ALT 14 10/30/2023   Last lipids Lab Results  Component Value Date   CHOL 154 10/30/2023   HDL 52.50 10/30/2023   LDLCALC 83 10/30/2023   TRIG 89.0 10/30/2023  CHOLHDL 3 10/30/2023   Last hemoglobin A1c Lab Results  Component Value Date   HGBA1C 5.4 10/04/2022   HGBA1C 5.4 10/04/2022   HGBA1C 5.4 (A) 10/04/2022   HGBA1C 5.4 10/04/2022   Last thyroid functions Lab Results  Component Value Date   TSH 2.10 03/01/2021   Last vitamin D Lab Results  Component Value Date   VD25OH 37.7 09/07/2022   IMPRESSION AND PLAN:  1 chronic pain syndrome: chronic neck, mid back and low back pain, DDD, 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 Used to be on Vicodin in small amounts but she can no longer afford the medication/insurance will not cover. I gave her the name of a few formulations of oxycodone and hydrocodone that she can ask her insurer about.  She will call with any information she gets.   2.  Hypertension, well-controlled on amlodipine 10 mg a day and Lopressor 25 mg twice daily. Electrolytes and creatinine today.   3.  Chronic renal insufficiency stage III. Avoiding NSAIDs. Electrolytes and creatinine monitoring today.  #4 recurrent major depressive disorder, GAD. Add Lexapro 5 mg a day.  Continue Xanax 1 mg 3 times daily.  #5 chronic constipation.  Encouraged her to take MiraLAX 1 capful daily.  Also encouraged increased water intake and try to minimize Coca-Cola and coffee.  An After Visit Summary was printed and given to the patient.  FOLLOW UP: No follow-ups on file. Next cpe 10/2024 Signed:  Santiago Bumpers, MD           01/29/2024

## 2024-01-29 ENCOUNTER — Ambulatory Visit (INDEPENDENT_AMBULATORY_CARE_PROVIDER_SITE_OTHER): Payer: Medicare Other | Admitting: Family Medicine

## 2024-01-29 ENCOUNTER — Encounter: Payer: Self-pay | Admitting: Family Medicine

## 2024-01-29 VITALS — BP 138/75 | HR 55 | Wt 141.8 lb

## 2024-01-29 DIAGNOSIS — F331 Major depressive disorder, recurrent, moderate: Secondary | ICD-10-CM

## 2024-01-29 DIAGNOSIS — N183 Chronic kidney disease, stage 3 unspecified: Secondary | ICD-10-CM

## 2024-01-29 DIAGNOSIS — Z23 Encounter for immunization: Secondary | ICD-10-CM

## 2024-01-29 DIAGNOSIS — G894 Chronic pain syndrome: Secondary | ICD-10-CM | POA: Diagnosis not present

## 2024-01-29 DIAGNOSIS — K5909 Other constipation: Secondary | ICD-10-CM

## 2024-01-29 DIAGNOSIS — M48062 Spinal stenosis, lumbar region with neurogenic claudication: Secondary | ICD-10-CM | POA: Diagnosis not present

## 2024-01-29 DIAGNOSIS — I10 Essential (primary) hypertension: Secondary | ICD-10-CM

## 2024-01-29 DIAGNOSIS — F411 Generalized anxiety disorder: Secondary | ICD-10-CM

## 2024-01-29 LAB — BASIC METABOLIC PANEL
BUN: 12 mg/dL (ref 6–23)
CO2: 29 meq/L (ref 19–32)
Calcium: 9.2 mg/dL (ref 8.4–10.5)
Chloride: 105 meq/L (ref 96–112)
Creatinine, Ser: 1.01 mg/dL (ref 0.40–1.20)
GFR: 54.2 mL/min — ABNORMAL LOW (ref >60.00)
Glucose, Bld: 93 mg/dL (ref 70–99)
Potassium: 3.6 meq/L (ref 3.5–5.1)
Sodium: 142 meq/L (ref 135–145)

## 2024-01-29 MED ORDER — METOPROLOL TARTRATE 25 MG PO TABS: 25.0000 mg | ORAL_TABLET | Freq: Two times a day (BID) | ORAL | 1 refills | Status: DC

## 2024-01-29 MED ORDER — ESCITALOPRAM OXALATE 5 MG PO TABS: 5.0000 mg | ORAL_TABLET | Freq: Every day | ORAL | 0 refills | Status: DC

## 2024-01-29 MED ORDER — FLUTICASONE PROPIONATE 50 MCG/ACT NA SUSP: 2.0000 | Freq: Every day | NASAL | 1 refills | Status: DC

## 2024-01-29 MED ORDER — ATORVASTATIN CALCIUM 20 MG PO TABS: 20.0000 mg | ORAL_TABLET | Freq: Every day | ORAL | 1 refills | Status: DC

## 2024-01-29 MED ORDER — AMLODIPINE BESYLATE 10 MG PO TABS
10.0000 mg | ORAL_TABLET | Freq: Every day | ORAL | 1 refills | Status: DC
Start: 2024-01-29 — End: 2024-08-04

## 2024-01-29 MED ORDER — PANTOPRAZOLE SODIUM 40 MG PO TBEC: 40.0000 mg | DELAYED_RELEASE_TABLET | Freq: Every day | ORAL | 1 refills | Status: DC

## 2024-02-19 ENCOUNTER — Encounter: Payer: Self-pay | Admitting: Family Medicine

## 2024-02-19 ENCOUNTER — Ambulatory Visit (INDEPENDENT_AMBULATORY_CARE_PROVIDER_SITE_OTHER): Admitting: Family Medicine

## 2024-02-19 VITALS — BP 112/68 | HR 66 | Temp 98.6°F | Ht 61.5 in | Wt 136.8 lb

## 2024-02-19 DIAGNOSIS — F331 Major depressive disorder, recurrent, moderate: Secondary | ICD-10-CM | POA: Diagnosis not present

## 2024-02-19 MED ORDER — ESCITALOPRAM OXALATE 10 MG PO TABS: 10.0000 mg | ORAL_TABLET | Freq: Every day | ORAL | 0 refills | Status: DC

## 2024-02-19 NOTE — Progress Notes (Signed)
 OFFICE VISIT  02/19/2024  CC:  Chief Complaint  Patient presents with   Medical Management of Chronic Issues    Patient is a 78 y.o. female who presents for 3-week follow-up anxiety and depression. Last visit I added Lexapro 5 mg a day to her Xanax 1 mg 3 times a day.  INTERIM HX: Renal function stable and electrolytes were normal last visit.  She feels a little bit better from an emotional/depression standpoint. No side effects from the Lexapro.   Past Medical History:  Diagnosis Date   Abdominal bruit    normal aortic ultrasound 10/2023   Anxiety and depression    Bilateral edema of lower extremity    Chronic pain syndrome    Dr. Laurian Brim   Chronic renal insufficiency, stage 3 (moderate) (HCC)    GFR 40s.  Renal u/s normal 06/2022   Frequent headaches    GERD (gastroesophageal reflux disease)    History of sepsis    UTI/admission   Hypercholesterolemia    Hypertension    Insomnia    Iron deficiency anemia    ?etiology.   Osteoarthritis, multiple sites    Feet, shoulders, back, hands    Past Surgical History:  Procedure Laterality Date   Aortic ultrasound     11/02/23 NORMAL   APPENDECTOMY  1986   Bunion and hammertoe surgery, right     10/27/2022 BUNION CORRECTION W/GREAT TOE FUSION, HAMMERTOE CORRECTION & METATARSAL SHORTENING 2ND, BONE GRAFTFROM HEEL   COLONOSCOPY  2018   normal ([facility] per pt report but that office has no records of her seeing them)   DEXA     01/2023 T score -2.3.  Rpt 2 yrs   ESOPHAGOGASTRODUODENOSCOPY     neg for bleeding ?eval    Outpatient Medications Prior to Visit  Medication Sig Dispense Refill   albuterol (VENTOLIN HFA) 108 (90 Base) MCG/ACT inhaler Inhale 2 puffs into the lungs every 6 (six) hours as needed for wheezing or shortness of breath. 18 g 0   ALPRAZolam (XANAX) 1 MG tablet TAKE 1 TABLET BY MOUTH 3 TIMES DAILY. 90 tablet 5   amLODipine (NORVASC) 10 MG tablet Take 1 tablet (10 mg total) by mouth daily. 90 tablet 1    Ascorbic Acid (VITAMIN C PO) Take by mouth daily.     aspirin 81 MG chewable tablet Chew 81 mg by mouth daily in the afternoon.     atorvastatin (LIPITOR) 20 MG tablet Take 1 tablet (20 mg total) by mouth daily. 90 tablet 1   Cholecalciferol (VITAMIN D3 PO) Take 50 mg by mouth daily.     Cyanocobalamin (VITAMIN B-12 PO) Take by mouth daily.     escitalopram (LEXAPRO) 5 MG tablet Take 1 tablet (5 mg total) by mouth daily. 30 tablet 0   fluticasone (FLONASE) 50 MCG/ACT nasal spray Place 2 sprays into both nostrils daily. 48 mL 1   Iron (IRCON PO) Take 65 mg by mouth daily.     metoprolol tartrate (LOPRESSOR) 25 MG tablet Take 1 tablet (25 mg total) by mouth 2 (two) times daily. 180 tablet 1   Omega-3 Fatty Acids (FISH OIL PO) Take by mouth daily.     pantoprazole (PROTONIX) 40 MG tablet Take 1 tablet (40 mg total) by mouth daily. Take 1 tablet by mouth daily. 90 tablet 1   traZODone (DESYREL) 50 MG tablet Take 1 tablet (50 mg total) by mouth daily. 90 tablet 1   VITAMIN E PO Take by mouth daily.  No facility-administered medications prior to visit.    Allergies  Allergen Reactions   Tramadol Itching    Review of Systems As per HPI  PE:    02/19/2024    9:21 AM 01/29/2024    8:03 AM 10/30/2023    9:33 AM  Vitals with BMI  Height 5' 1.5"  5' 1.5"  Weight 136 lbs 13 oz 141 lbs 13 oz 138 lbs 13 oz  BMI 25.43  25.8  Systolic 112 138 409  Diastolic 68 75 74  Pulse 66 55 70     Physical Exam  Gen: Alert, well appearing.  Patient is oriented to person, place, time, and situation. AFFECT: pleasant, lucid thought and speech. No further exam today  LABS:  Last metabolic panel Lab Results  Component Value Date   GLUCOSE 93 01/29/2024   NA 142 01/29/2024   K 3.6 01/29/2024   CL 105 01/29/2024   CO2 29 01/29/2024   BUN 12 01/29/2024   CREATININE 1.01 01/29/2024   GFR 54.20 (L) 01/29/2024   CALCIUM 9.2 01/29/2024   PROT 6.3 10/30/2023   ALBUMIN 3.9 10/30/2023    BILITOT 0.6 10/30/2023   ALKPHOS 62 10/30/2023   AST 19 10/30/2023   ALT 14 10/30/2023    IMPRESSION AND PLAN:  Major depressive disorder, active, moderate severity. Increase Lexapro to 10 mg a day. Her 2 children live with her.  They are in their 66s.  They have plans to likely be out of her home in the next 6 months or so.  Things will improve for her after this happens as well.  An After Visit Summary was printed and given to the patient.  FOLLOW UP: No follow-ups on file. Next CPE 10/2024 Signed:  Arletha Lady, MD           02/19/2024

## 2024-02-20 ENCOUNTER — Ambulatory Visit
Admission: RE | Admit: 2024-02-20 | Discharge: 2024-02-20 | Disposition: A | Discharge status: Home / Self Care | Payer: Medicare Other | Source: Ambulatory Visit | Attending: Family Medicine | Admitting: Family Medicine

## 2024-02-20 DIAGNOSIS — Z1231 Encounter for screening mammogram for malignant neoplasm of breast: Secondary | ICD-10-CM | POA: Diagnosis not present

## 2024-03-13 ENCOUNTER — Other Ambulatory Visit: Payer: Self-pay | Admitting: Family Medicine

## 2024-03-18 ENCOUNTER — Ambulatory Visit (INDEPENDENT_AMBULATORY_CARE_PROVIDER_SITE_OTHER): Admitting: Family Medicine

## 2024-03-18 ENCOUNTER — Encounter: Payer: Self-pay | Admitting: Family Medicine

## 2024-03-18 VITALS — BP 110/60 | HR 56 | Temp 98.2°F | Ht 61.5 in | Wt 136.2 lb

## 2024-03-18 DIAGNOSIS — F3342 Major depressive disorder, recurrent, in full remission: Secondary | ICD-10-CM | POA: Diagnosis not present

## 2024-03-18 DIAGNOSIS — H9313 Tinnitus, bilateral: Secondary | ICD-10-CM

## 2024-03-18 DIAGNOSIS — H9193 Unspecified hearing loss, bilateral: Secondary | ICD-10-CM | POA: Diagnosis not present

## 2024-03-18 MED ORDER — ESCITALOPRAM OXALATE 10 MG PO TABS: 10.0000 mg | ORAL_TABLET | Freq: Every day | ORAL | 1 refills | Status: DC

## 2024-03-18 NOTE — Progress Notes (Signed)
 OFFICE VISIT  03/18/2024  CC:  Chief Complaint  Patient presents with   Depression    4 week f/u    Patient is a 78 y.o. female who presents for 1 month follow-up depression. A/P as of last visit: "Major depressive disorder, active, moderate severity. Increase Lexapro  to 10 mg a day. Her 2 children live with her.  They are in their 59s.  They have plans to likely be out of her home in the next 6 months or so.  Things will improve for her after this happens as well."  INTERIM HX: Doing much better, feels like the 10 mg dose is good for her.  No crying spells or hopelessness. She remains active and is sleeping better. No side effects from Lexapro .  She has chronically decreased hearing and progressive ringing in the ears, particularly the right ear.   Past Medical History:  Diagnosis Date   Abdominal bruit    normal aortic ultrasound 10/2023   Anxiety and depression    Bilateral edema of lower extremity    Chronic pain syndrome    Dr. Vonn Guard   Chronic renal insufficiency, stage 3 (moderate) (HCC)    GFR 40s.  Renal u/s normal 06/2022   Frequent headaches    GERD (gastroesophageal reflux disease)    History of sepsis    UTI/admission   Hypercholesterolemia    Hypertension    Insomnia    Iron deficiency anemia    ?etiology.   Osteoarthritis, multiple sites    Feet, shoulders, back, hands    Past Surgical History:  Procedure Laterality Date   Aortic ultrasound     11/02/23 NORMAL   APPENDECTOMY  1986   Bunion and hammertoe surgery, right     10/27/2022 BUNION CORRECTION W/GREAT TOE FUSION, HAMMERTOE CORRECTION & METATARSAL SHORTENING 2ND, BONE GRAFTFROM HEEL   COLONOSCOPY  2018   normal ([facility] spec per pt report but that office has no records of her seeing them)   DEXA     01/2023 T score -2.3.  Rpt 2 yrs   ESOPHAGOGASTRODUODENOSCOPY     neg for bleeding ?eval    Outpatient Medications Prior to Visit  Medication Sig Dispense Refill   albuterol  (VENTOLIN   HFA) 108 (90 Base) MCG/ACT inhaler Inhale 2 puffs into the lungs every 6 (six) hours as needed for wheezing or shortness of breath. 18 g 0   ALPRAZolam  (XANAX ) 1 MG tablet TAKE 1 TABLET BY MOUTH 3 TIMES DAILY. 90 tablet 5   amLODipine  (NORVASC ) 10 MG tablet Take 1 tablet (10 mg total) by mouth daily. 90 tablet 1   Ascorbic Acid (VITAMIN C PO) Take by mouth daily.     aspirin 81 MG chewable tablet Chew 81 mg by mouth daily in the afternoon.     atorvastatin  (LIPITOR) 20 MG tablet Take 1 tablet (20 mg total) by mouth daily. 90 tablet 1   Cholecalciferol (VITAMIN D3 PO) Take 50 mg by mouth daily.     Cyanocobalamin (VITAMIN B-12 PO) Take by mouth daily.     fluticasone  (FLONASE ) 50 MCG/ACT nasal spray Place 2 sprays into both nostrils daily. 48 mL 1   Iron (IRCON PO) Take 65 mg by mouth daily.     metoprolol  tartrate (LOPRESSOR ) 25 MG tablet Take 1 tablet (25 mg total) by mouth 2 (two) times daily. 180 tablet 1   Omega-3 Fatty Acids (FISH OIL PO) Take by mouth daily.     pantoprazole  (PROTONIX ) 40 MG tablet Take 1 tablet (  40 mg total) by mouth daily. Take 1 tablet by mouth daily. 90 tablet 1   traZODone  (DESYREL ) 50 MG tablet Take 1 tablet (50 mg total) by mouth daily. 90 tablet 1   VITAMIN E PO Take by mouth daily.     escitalopram  (LEXAPRO ) 10 MG tablet Take 1 tablet (10 mg total) by mouth daily. 30 tablet 0   No facility-administered medications prior to visit.    Allergies  Allergen Reactions   Tramadol Itching    Review of Systems As per HPI  PE:    03/18/2024    8:46 AM 02/19/2024    9:21 AM 01/29/2024    8:03 AM  Vitals with BMI  Height 5' 1.5" 5' 1.5"   Weight 136 lbs 3 oz 136 lbs 13 oz 141 lbs 13 oz  BMI 25.32 25.43   Systolic 110 112 161  Diastolic 60 68 75  Pulse 56 66 55     Physical Exam  Gen: Alert, well appearing.  Patient is oriented to person, place, time, and situation. Ears: External auditory canals completely clear.  Tympanic membranes appear  normal.  LABS:  Last metabolic panel Lab Results  Component Value Date   GLUCOSE 93 01/29/2024   NA 142 01/29/2024   K 3.6 01/29/2024   CL 105 01/29/2024   CO2 29 01/29/2024   BUN 12 01/29/2024   CREATININE 1.01 01/29/2024   GFR 54.20 (L) 01/29/2024   CALCIUM  9.2 01/29/2024   PROT 6.3 10/30/2023   ALBUMIN 3.9 10/30/2023   BILITOT 0.6 10/30/2023   ALKPHOS 62 10/30/2023   AST 19 10/30/2023   ALT 14 10/30/2023   IMPRESSION AND PLAN:  #1 Major depressive disorder, moderate severity, in remission. Continue Lexapro  10 mg a day.  #2 decreased hearing, tinnitus.  Refer to [facility].  3.  Chronic pain syndrome.  She has gotten off her opioid pain medication (there was an issue with cost and availability for quite a while) and wants to stay off of it for now.  An After Visit Summary was printed and given to the patient.  FOLLOW UP: Return in about 6 months (around 09/18/2024) for routine chronic illness f/u.  Next CPE 10/2024

## 2024-03-19 ENCOUNTER — Other Ambulatory Visit: Payer: Self-pay | Admitting: Family Medicine

## 2024-04-18 DIAGNOSIS — E785 Hyperlipidemia, unspecified: Secondary | ICD-10-CM | POA: Diagnosis not present

## 2024-04-18 DIAGNOSIS — N1831 Chronic kidney disease, stage 3a: Secondary | ICD-10-CM | POA: Diagnosis not present

## 2024-04-18 DIAGNOSIS — Z79899 Other long term (current) drug therapy: Secondary | ICD-10-CM | POA: Diagnosis not present

## 2024-04-18 DIAGNOSIS — F331 Major depressive disorder, recurrent, moderate: Secondary | ICD-10-CM | POA: Diagnosis not present

## 2024-04-18 DIAGNOSIS — Z008 Encounter for other general examination: Secondary | ICD-10-CM | POA: Diagnosis not present

## 2024-04-18 DIAGNOSIS — I129 Hypertensive chronic kidney disease with stage 1 through stage 4 chronic kidney disease, or unspecified chronic kidney disease: Secondary | ICD-10-CM | POA: Diagnosis not present

## 2024-07-21 ENCOUNTER — Other Ambulatory Visit: Payer: Self-pay | Admitting: Family Medicine

## 2024-07-21 NOTE — Telephone Encounter (Signed)
 Refill requested for Alprazolam  [facility] [place].  Last OV 5/13 Next OV 11/13

## 2024-08-02 ENCOUNTER — Other Ambulatory Visit: Payer: Self-pay | Admitting: Family Medicine

## 2024-08-14 ENCOUNTER — Other Ambulatory Visit: Payer: Self-pay | Admitting: Family Medicine

## 2024-09-05 ENCOUNTER — Other Ambulatory Visit: Payer: Self-pay | Admitting: Family Medicine

## 2024-09-05 MED ORDER — ATORVASTATIN CALCIUM 20 MG PO TABS: 20.0000 mg | ORAL_TABLET | Freq: Every day | ORAL | 0 refills | Status: DC

## 2024-09-05 NOTE — Telephone Encounter (Signed)
 Copied from CRM [id number]. Topic: Clinical - Medication Refill >> Sep 05, 2024  1:30 PM Thersia C wrote: Medication: atorvastatin  (LIPITOR) 20 MG tablet  Has the patient contacted their pharmacy? Yes (Agent: If no, request that the patient contact the pharmacy for the refill. If patient does not wish to contact the pharmacy document the reason why and proceed with request.) (Agent: If yes, when and what did the pharmacy advise?)  This is the patient's preferred pharmacy:  [id number] - [place], [place] - [address] [place] [address] [place] [address] [place] [address] Phone: [phone] Fax: [phone]  [id number] [place], [place] - [address] AT [facility] [address] [place] [address] Phone: [phone] Fax: [phone]  Is this the correct pharmacy for this prescription? Yes If no, delete pharmacy and type the correct one.   Has the prescription been filled recently? No  Is the patient out of the medication? Yes  Has the patient been seen for an appointment in the last year OR does the patient have an upcoming appointment? Yes  Can we respond through MyChart? Yes  Agent: Please be advised that Rx refills may take up to 3 business days. We ask that you follow-up with your pharmacy.

## 2024-09-10 ENCOUNTER — Other Ambulatory Visit: Payer: Self-pay | Admitting: Family Medicine

## 2024-09-18 ENCOUNTER — Ambulatory Visit (INDEPENDENT_AMBULATORY_CARE_PROVIDER_SITE_OTHER): Admitting: Family Medicine

## 2024-09-18 ENCOUNTER — Encounter: Payer: Self-pay | Admitting: Family Medicine

## 2024-09-18 VITALS — BP 125/67 | HR 48 | Temp 98.1°F | Ht 61.5 in | Wt 126.2 lb

## 2024-09-18 DIAGNOSIS — Z79899 Other long term (current) drug therapy: Secondary | ICD-10-CM

## 2024-09-18 DIAGNOSIS — E7800 Pure hypercholesterolemia, unspecified: Secondary | ICD-10-CM

## 2024-09-18 DIAGNOSIS — K2950 Unspecified chronic gastritis without bleeding: Secondary | ICD-10-CM

## 2024-09-18 DIAGNOSIS — I10 Essential (primary) hypertension: Secondary | ICD-10-CM | POA: Diagnosis not present

## 2024-09-18 DIAGNOSIS — F339 Major depressive disorder, recurrent, unspecified: Secondary | ICD-10-CM

## 2024-09-18 DIAGNOSIS — R1310 Dysphagia, unspecified: Secondary | ICD-10-CM

## 2024-09-18 DIAGNOSIS — Z0000 Encounter for general adult medical examination without abnormal findings: Secondary | ICD-10-CM | POA: Diagnosis not present

## 2024-09-18 DIAGNOSIS — R1013 Epigastric pain: Secondary | ICD-10-CM

## 2024-09-18 DIAGNOSIS — N2889 Other specified disorders of kidney and ureter: Secondary | ICD-10-CM

## 2024-09-18 DIAGNOSIS — K219 Gastro-esophageal reflux disease without esophagitis: Secondary | ICD-10-CM

## 2024-09-18 DIAGNOSIS — F411 Generalized anxiety disorder: Secondary | ICD-10-CM

## 2024-09-18 DIAGNOSIS — K449 Diaphragmatic hernia without obstruction or gangrene: Secondary | ICD-10-CM

## 2024-09-18 LAB — LIPID PANEL
Cholesterol: 183 mg/dL (ref 0–200)
HDL: 71.6 mg/dL (ref >39.00)
LDL Cholesterol: 92 mg/dL (ref 0–99)
NonHDL: 111.19
Total CHOL/HDL Ratio: 3
Triglycerides: 95 mg/dL (ref 0.0–149.0)
VLDL: 19 mg/dL (ref 0.0–40.0)

## 2024-09-18 LAB — CBC WITH DIFFERENTIAL/PLATELET
Basophils Absolute: 0.1 K/uL (ref 0.0–0.1)
Basophils Relative: 1.4 % (ref 0.0–3.0)
Eosinophils Absolute: 0.2 K/uL (ref 0.0–0.7)
Eosinophils Relative: 4.6 % (ref 0.0–5.0)
HCT: 38.5 % (ref 36.0–46.0)
Hemoglobin: 12.8 g/dL (ref 12.0–15.0)
Lymphocytes Relative: 46.4 % — ABNORMAL HIGH (ref 12.0–46.0)
Lymphs Abs: 2.1 K/uL (ref 0.7–4.0)
MCHC: 33.4 g/dL (ref 30.0–36.0)
MCV: 88.7 fl (ref 78.0–100.0)
Monocytes Absolute: 0.5 K/uL (ref 0.1–1.0)
Monocytes Relative: 10.7 % (ref 3.0–12.0)
Neutro Abs: 1.7 K/uL (ref 1.4–7.7)
Neutrophils Relative %: 36.9 % — ABNORMAL LOW (ref 43.0–77.0)
Platelets: 249 K/uL (ref 150.0–400.0)
RBC: 4.34 Mil/uL (ref 3.87–5.11)
RDW: 14.6 % (ref 11.5–15.5)
WBC: 4.5 K/uL (ref 4.0–10.5)

## 2024-09-18 LAB — COMPREHENSIVE METABOLIC PANEL WITH GFR
ALT: 16 U/L (ref 0–35)
AST: 18 U/L (ref 0–37)
Albumin: 4.1 g/dL (ref 3.5–5.2)
Alkaline Phosphatase: 58 U/L (ref 39–117)
BUN: 12 mg/dL (ref 6–23)
CO2: 31 meq/L (ref 19–32)
Calcium: 9.4 mg/dL (ref 8.4–10.5)
Chloride: 104 meq/L (ref 96–112)
Creatinine, Ser: 1.17 mg/dL (ref 0.40–1.20)
GFR: 45.23 mL/min — ABNORMAL LOW (ref >60.00)
Glucose, Bld: 78 mg/dL (ref 70–99)
Potassium: 4.1 meq/L (ref 3.5–5.1)
Sodium: 141 meq/L (ref 135–145)
Total Bilirubin: 0.7 mg/dL (ref 0.2–1.2)
Total Protein: 6.6 g/dL (ref 6.0–8.3)

## 2024-09-18 LAB — LIPASE: Lipase: 29 U/L (ref 11.0–59.0)

## 2024-09-18 MED ORDER — PANTOPRAZOLE SODIUM 40 MG PO TBEC: 40.0000 mg | DELAYED_RELEASE_TABLET | Freq: Every day | ORAL | 3 refills | Status: DC

## 2024-09-18 MED ORDER — FLUTICASONE PROPIONATE 50 MCG/ACT NA SUSP: 2.0000 | Freq: Every day | NASAL | 3 refills | Status: AC

## 2024-09-18 MED ORDER — ATORVASTATIN CALCIUM 20 MG PO TABS: 20.0000 mg | ORAL_TABLET | Freq: Every day | ORAL | 3 refills | Status: DC

## 2024-09-18 MED ORDER — PANTOPRAZOLE SODIUM 40 MG PO TBEC: 40.0000 mg | DELAYED_RELEASE_TABLET | Freq: Two times a day (BID) | ORAL | 0 refills | Status: DC

## 2024-09-18 MED ORDER — AMLODIPINE BESYLATE 10 MG PO TABS: 10.0000 mg | ORAL_TABLET | Freq: Every day | ORAL | 3 refills | Status: DC

## 2024-09-18 NOTE — Patient Instructions (Signed)
 Check with your insurer about coverage for the following antidepressants: Sertraline, citalopram, fluoxetine, duloxetine, paroxetine, Trintellix, Viibryd, and mirtazapine. Let our office know which one they prefer and I will get you started on it.

## 2024-09-18 NOTE — Progress Notes (Signed)
 Office Note 09/18/2024  CC:  Chief Complaint  Patient presents with   Medical Management of Chronic Issues    HPI:  Patient is a 78 y.o. female who is here for annual health maintenance exam and follow-up hypertension, hyperlipidemia, depression, and chronic renal insufficiency stage III.  Sheila Newman has not been feeling well.  She is under a lot of stress chronically. She also notes feeling significant lack of appetite.  She has some trouble swallowing and intermittent pain in the epigastric region, sometimes will vomit.  No diarrhea, hematochezia, or melena.  She no longer takes Lexapro  or trazodone  because insurance stopped covering them. She is highly anxious all the time.  This leads to periods of feeling depressed, severity waxed and waned. She has financial issues, feels overwhelmed.  Chronic pain diffusely in the back of her neck and tops of her shoulders.   Past Medical History:  Diagnosis Date   Abdominal bruit    normal aortic ultrasound 10/2023   Anxiety and depression    Bilateral edema of lower extremity    Chronic pain syndrome    Dr. Rosella   Chronic renal insufficiency, stage 3 (moderate)    GFR 40s.  Renal u/s normal 06/2022   Frequent headaches    GERD (gastroesophageal reflux disease)    History of sepsis    UTI/admission   Hypercholesterolemia    Hypertension    Insomnia    Iron deficiency anemia    ?etiology.   Osteoarthritis, multiple sites    Feet, shoulders, back, hands    Past Surgical History:  Procedure Laterality Date   Aortic ultrasound     11/02/23 NORMAL   APPENDECTOMY  1986   Bunion and hammertoe surgery, right     10/27/2022 BUNION CORRECTION W/GREAT TOE FUSION, HAMMERTOE CORRECTION & METATARSAL SHORTENING 2ND, BONE GRAFTFROM HEEL   COLONOSCOPY  2018   normal (dig [facility] spec per pt report but that office has no records of her seeing them)   DEXA     01/2023 T score -2.3.  Rpt 2 yrs   ESOPHAGOGASTRODUODENOSCOPY     neg for  bleeding ?eval    Family History  Problem Relation Age of Onset   Arthritis Mother    Diabetes Mother    Heart attack Father    Diabetes Father    Arthritis Sister    COPD Sister    Diabetes Brother    Diabetes Brother    Diabetes Brother    Diabetes Brother    Diabetes Brother    Diabetes Brother    Breast cancer Neg Hx     Social History   Socioeconomic History   Marital status: Divorced    Spouse name: Not on file   Number of children: Not on file   Years of education: Not on file   Highest education level: Not on file  Occupational History   Not on file  Tobacco Use   Smoking status: Never   Smokeless tobacco: Never  Substance and Sexual Activity   Alcohol use: Never   Drug use: Never   Sexual activity: Not on file  Other Topics Concern   Not on file  Social History Narrative   Widowed-->reMarried, 2 biologic, 2 adopted.   Educ: HS.  From [place]   Occup: [employer] x 40 yrs.  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   No T/A/Ds.   Social Drivers of Corporate Investment Banker Strain: Low Risk  (01/23/2024)   Overall Physicist, Medical Strain (  CARDIA)    Difficulty of Paying Living Expenses: Not very hard  Food Insecurity: Food Insecurity Present (01/23/2024)   Hunger Vital Sign    Worried About Running Out of Food in the Last Year: Sometimes true    Ran Out of Food in the Last Year: Sometimes true  Transportation Needs: No Transportation Needs (01/23/2024)   PRAPARE - Administrator, Civil Service (Medical): No    Lack of Transportation (Non-Medical): No  Physical Activity: Inactive (01/23/2024)   Exercise Vital Sign    Days of Exercise per Week: 0 days    Minutes of Exercise per Session: 0 min  Stress: Stress Concern Present (01/23/2024)   [employer] of Occupational Health - Occupational Stress Questionnaire    Feeling of Stress : Rather much  Social Connections: Moderately Integrated (01/23/2024)   Social Connection and Isolation  Panel    Frequency of Communication with Friends and Family: Twice a week    Frequency of Social Gatherings with Friends and Family: Three times a week    Attends Religious Services: More than 4 times per year    Active Member of Clubs or Organizations: Yes    Attends Banker Meetings: More than 4 times per year    Marital Status: Widowed  Intimate Partner Violence: Not At Risk (01/23/2024)   Humiliation, Afraid, Rape, and Kick questionnaire    Fear of Current or Ex-Partner: No    Emotionally Abused: No    Physically Abused: No    Sexually Abused: No    Outpatient Medications Prior to Visit  Medication Sig Dispense Refill   albuterol  (VENTOLIN  HFA) 108 (90 Base) MCG/ACT inhaler Inhale 2 puffs into the lungs every 6 (six) hours as needed for wheezing or shortness of breath. 18 g 0   Ascorbic Acid (VITAMIN C PO) Take by mouth daily.     aspirin 81 MG chewable tablet Chew 81 mg by mouth daily in the afternoon.     Cyanocobalamin (VITAMIN B-12 PO) Take by mouth daily.     metoprolol  tartrate (LOPRESSOR ) 25 MG tablet TAKE 1 TABLET BY MOUTH TWICE A DAY 180 tablet 0   Omega-3 Fatty Acids (FISH OIL PO) Take by mouth daily.     VITAMIN E PO Take by mouth daily.     ALPRAZolam  (XANAX ) 1 MG tablet TAKE 1 TABLET BY MOUTH THREE TIMES A DAY 90 tablet 5   Cholecalciferol (VITAMIN D3 PO) Take 50 mg by mouth daily.     Iron (IRCON PO) Take 65 mg by mouth daily.     amLODipine  (NORVASC ) 10 MG tablet TAKE 1 TABLET BY MOUTH EVERY DAY 60 tablet 0   atorvastatin  (LIPITOR) 20 MG tablet Take 1 tablet (20 mg total) by mouth daily. 30 tablet 0   escitalopram  (LEXAPRO ) 10 MG tablet TAKE 1 TABLET BY MOUTH EVERY DAY (Patient not taking: Reported on 09/18/2024) 30 tablet 0   fluticasone  (FLONASE ) 50 MCG/ACT nasal spray Place 2 sprays into both nostrils daily. 48 mL 1   pantoprazole  (PROTONIX ) 40 MG tablet Take 1 tablet (40 mg total) by mouth daily. Take 1 tablet by mouth daily. 90 tablet 1   traZODone   (DESYREL ) 50 MG tablet TAKE 1 TABLET BY MOUTH EVERY DAY (Patient not taking: Reported on 09/18/2024) 30 tablet 0   No facility-administered medications prior to visit.    Allergies  Allergen Reactions   Tramadol Itching    Review of Systems  Constitutional:  Negative for appetite change, chills, fatigue  and fever.  HENT:  Negative for congestion, dental problem, ear pain and sore throat.   Eyes:  Negative for discharge, redness and visual disturbance.  Respiratory:  Negative for cough, chest tightness, shortness of breath and wheezing.   Cardiovascular:  Negative for chest pain, palpitations and leg swelling.  Gastrointestinal:  Positive for abdominal pain, nausea and vomiting. Negative for blood in stool and diarrhea.  Genitourinary:  Negative for difficulty urinating, dysuria, flank pain, frequency, hematuria and urgency.  Musculoskeletal:  Positive for arthralgias, myalgias and neck pain. Negative for back pain, joint swelling and neck stiffness.  Skin:  Negative for pallor and rash.  Neurological:  Negative for dizziness, speech difficulty, weakness and headaches.  Hematological:  Negative for adenopathy. Does not bruise/bleed easily.  Psychiatric/Behavioral:  Positive for dysphoric mood and sleep disturbance. Negative for confusion and suicidal ideas. The patient is nervous/anxious.     PE;    09/18/2024    8:06 AM 03/18/2024    8:46 AM 02/19/2024    9:21 AM  Vitals with BMI  Height 5' 1.5 5' 1.5 5' 1.5  Weight 126 lbs 3 oz 136 lbs 3 oz 136 lbs 13 oz  BMI 23.46 25.32 25.43  Systolic 125 110 887  Diastolic 67 60 68  Pulse 48 56 66   Exam chaperoned by Cloe Motsinger, CMA Gen: Alert, well appearing.  Patient is oriented to person, place, time, and situation. AFFECT: pleasant, lucid thought and speech. ENT: Ears: EACs clear, normal epithelium.  TMs with good light reflex and landmarks bilaterally.  Eyes: no injection, icteris, swelling, or exudate.  EOMI, PERRLA. Nose:  no drainage or turbinate edema/swelling.  No injection or focal lesion.  Mouth: lips without lesion/swelling.  Oral mucosa pink and moist.  Dentition intact and without obvious caries or gingival swelling.  Oropharynx without erythema, exudate, or swelling.  Neck: supple/nontender.  No LAD, mass, or TM.  Carotid pulses 2+ bilaterally, without bruits. CV: RRR, no m/r/g.   LUNGS: CTA bilat, nonlabored resps, good aeration in all lung fields. ABD: soft, NT, ND, BS normal.  No hepatospenomegaly or mass.  No bruits. EXT: no clubbing, cyanosis, or edema.  Musculoskeletal: no joint swelling, erythema, warmth, or tenderness.  ROM of all joints intact. Skin - no sores or suspicious lesions or rashes or color changes  Pertinent labs:  Lab Results  Component Value Date   TSH 2.10 03/01/2021   Lab Results  Component Value Date   WBC 5.1 10/30/2023   HGB 12.0 10/30/2023   HCT 36.8 10/30/2023   MCV 90.0 10/30/2023   PLT 251.0 10/30/2023   Lab Results  Component Value Date   CREATININE 1.01 01/29/2024   BUN 12 01/29/2024   NA 142 01/29/2024   K 3.6 01/29/2024   CL 105 01/29/2024   CO2 29 01/29/2024   Lab Results  Component Value Date   ALT 14 10/30/2023   AST 19 10/30/2023   ALKPHOS 62 10/30/2023   BILITOT 0.6 10/30/2023   Lab Results  Component Value Date   CHOL 154 10/30/2023   Lab Results  Component Value Date   HDL 52.50 10/30/2023   Lab Results  Component Value Date   LDLCALC 83 10/30/2023   Lab Results  Component Value Date   TRIG 89.0 10/30/2023   Lab Results  Component Value Date   CHOLHDL 3 10/30/2023   Lab Results  Component Value Date   HGBA1C 5.4 10/04/2022   HGBA1C 5.4 10/04/2022   HGBA1C 5.4 (A) 10/04/2022  HGBA1C 5.4 10/04/2022   ASSESSMENT AND PLAN:    Health maintenance exam: Reviewed age and gender appropriate health maintenance issues (prudent diet, regular exercise, health risks of tobacco and excessive alcohol, use of seatbelts, fire alarms  in home, use of sunscreen).  Also reviewed age and gender appropriate health screening as well as vaccine recommendations. Vaccines: ALL UTD Labs:cbc,lipids,cmet Cervical ca screening: No further screening due to age Breast ca screening: Screening mammogram normal April 2025. Colon ca screening: Negative colonoscopy in 2018 per patient report.  No records.  Pt will consider repeat colonoscopy or stool-based test in 2028.  Osteoporosis screening: DEXA 01/2023 T-score -2.3.  Plan repeat around 01/2025.  #2 GERD with dysphagia.  I also suspect she has significant gastritis lately.  She is down 10 pounds over the last 6 months.  She is down about 15 pounds over the last 14 months. Less suspicious of symptomatic cholelithiasis. Check H. pylori today, CBC and CMET and lipase. Increase pantoprazole  to 40 mg twice a day.  #3 hypertension, well-controlled on Lopressor  25 mg twice daily and amlodipine  10 mg a day. Electrolytes and creatinine monitoring today.  4.  Hypercholesterolemia, doing well on atorvastatin  20 mg a day. Lipid panel and hepatic panel today.  #5 GAD, history of depression. I gave her a list of antidepressants to ask her insurer about. We will continue her on alprazolam  1 mg 3 times daily.  An After Visit Summary was printed and given to the patient.  FOLLOW UP:  Return in about 2 weeks (around 10/02/2024) for Recheck gastritis.  Signed:  Gerlene Hockey, MD           09/18/2024

## 2024-09-19 ENCOUNTER — Ambulatory Visit: Payer: Self-pay | Admitting: Family Medicine

## 2024-09-19 LAB — H. PYLORI BREATH TEST: H. pylori Breath Test: NOT DETECTED

## 2024-09-19 LAB — DRUG MONITOR, PANEL 1, SCREEN, URINE
Amphetamines: NEGATIVE ng/mL (ref <500)
Barbiturates: NEGATIVE ng/mL (ref <300)
Benzodiazepines: POSITIVE ng/mL — AB (ref <100)
Cocaine Metabolite: NEGATIVE ng/mL (ref <150)
Creatinine: 148.4 mg/dL (ref >=20.0)
Marijuana Metabolite: POSITIVE ng/mL — AB (ref <20)
Methadone Metabolite: NEGATIVE ng/mL (ref <100)
Opiates: NEGATIVE ng/mL (ref <100)
Oxidant: NEGATIVE ug/mL (ref <200)
Oxycodone: NEGATIVE ng/mL (ref <100)
Phencyclidine: NEGATIVE ng/mL (ref <25)
pH: 7.1 (ref 4.5–9.0)

## 2024-09-19 LAB — DM TEMPLATE

## 2024-09-29 ENCOUNTER — Other Ambulatory Visit: Payer: Self-pay | Admitting: Family Medicine

## 2024-09-29 NOTE — Telephone Encounter (Unsigned)
 Copied from CRM #[id number]. Topic: Clinical - Medication Refill >> Sep 29, 2024  5:30 PM Shereese L wrote: Medication: atorvastatin  (LIPITOR) 20 MG tablet  Has the patient contacted their pharmacy? Yes (Agent: If no, request that the patient contact the pharmacy for the refill. If patient does not wish to contact the pharmacy document the reason why and proceed with request.) (Agent: If yes, when and what did the pharmacy advise?)  This is the patient's preferred pharmacy:  [id number] - [place], [place] - [address] [place] [address] [place] [address] [place] [address] Phone: [phone] Fax: [phone]  Is this the correct pharmacy for this prescription? Yes If no, delete pharmacy and type the correct one.   Has the prescription been filled recently? Yes  Is the patient out of the medication? Yes  Has the patient been seen for an appointment in the last year OR does the patient have an upcoming appointment? Yes  Can we respond through MyChart? Yes  Agent: Please be advised that Rx refills may take up to 3 business days. We ask that you follow-up with your pharmacy.

## 2024-10-02 ENCOUNTER — Other Ambulatory Visit: Payer: Self-pay | Admitting: Family Medicine

## 2024-10-04 ENCOUNTER — Other Ambulatory Visit: Payer: Self-pay | Admitting: Family Medicine

## 2024-10-08 ENCOUNTER — Ambulatory Visit: Admitting: Family Medicine

## 2024-10-09 ENCOUNTER — Other Ambulatory Visit: Payer: Self-pay | Admitting: Family Medicine

## 2024-11-13 ENCOUNTER — Other Ambulatory Visit: Payer: Self-pay | Admitting: Family Medicine

## 2024-11-17 ENCOUNTER — Other Ambulatory Visit: Payer: Self-pay | Admitting: Family Medicine

## 2024-11-17 NOTE — Telephone Encounter (Signed)
 Appears to have refills at pharmacy possibly, please advise.

## 2024-11-17 NOTE — Telephone Encounter (Unsigned)
 Copied from CRM #[id number]. Topic: Clinical - Medication Refill >> Nov 17, 2024  4:43 PM Nessti S wrote: Medication: metoprolol  tartrate (LOPRESSOR ) 25 MG tablet pantoprazole  (PROTONIX ) 40 MG tablet  Has the patient contacted their pharmacy? Yes (Agent: If no, request that the patient contact the pharmacy for the refill. If patient does not wish to contact the pharmacy document the reason why and proceed with request.) (Agent: If yes, when and what did the pharmacy advise?)  This is the patient's preferred pharmacy:  [id number] - [place], [place] - [address] [facility] [address] [place] [address] Phone: [phone] Fax: [phone]  Is this the correct pharmacy for this prescription? Yes If no, delete pharmacy and type the correct one.   Has the prescription been filled recently? No. [facility]  Is the patient out of the medication? Yes  Has the patient been seen for an appointment in the last year OR does the patient have an upcoming appointment? Yes  Can we respond through MyChart? No  Agent: Please be advised that Rx refills may take up to 3 business days. We ask that you follow-up with your pharmacy.

## 2024-11-18 ENCOUNTER — Other Ambulatory Visit: Payer: Self-pay

## 2024-11-18 ENCOUNTER — Telehealth: Payer: Self-pay

## 2024-11-18 ENCOUNTER — Other Ambulatory Visit: Payer: Self-pay | Admitting: Family Medicine

## 2024-11-18 MED ORDER — ALPRAZOLAM 1 MG PO TABS: 1.0000 mg | ORAL_TABLET | Freq: Three times a day (TID) | ORAL | 5 refills | Status: AC

## 2024-11-18 NOTE — Telephone Encounter (Signed)
 Requesting: alprazolam  Contract: 10/04/22 UDS: 10/30/23 Last Visit: 09/18/24 Next Visit: no visit date found Last Refill: 07/21/24 (90,5)  Please Advise. Rx pending, pt has had a change in pharmacies.

## 2024-11-18 NOTE — Telephone Encounter (Signed)
 Copied from CRM [id number]. Topic: Clinical - Medication Refill >> Nov 18, 2024  2:36 PM Mercedes MATSU wrote: Medication:  pantoprazole  (PROTONIX ) 40 MG tablet  metoprolol  tartrate (LOPRESSOR ) 25 MG tablet   Has the patient contacted their pharmacy? Yes (Agent: If no, request that the patient contact the pharmacy for the refill. If patient does not wish to contact the pharmacy document the reason why and proceed with request.) (Agent: If yes, when and what did the pharmacy advise?)  This is the patient's preferred pharmacy:  [id number] - [place], [place] - [address] AT [facility] [address] [place] [address] Phone: [phone] Fax: [phone]  Is this the correct pharmacy for this prescription? Yes If no, delete pharmacy and type the correct one.   Has the prescription been filled recently? Yes  Is the patient out of the medication? Yes  Has the patient been seen for an appointment in the last year OR does the patient have an upcoming appointment? Yes  Can we respond through MyChart? Yes  Agent: Please be advised that Rx refills may take up to 3 business days. We ask that you follow-up with your pharmacy.

## 2024-11-18 NOTE — Telephone Encounter (Signed)
 Spoke with pt, advised she could contact [facility] to have them [facility]. She stated she is running out of 2 meds. She does not know how to get meds transferred, tried to offer assistance by confirming which 2, the only one she could recall was Metoprolol . She is going to call the pharmacy to confirm and call the office back to let me know.

## 2024-12-12 ENCOUNTER — Encounter: Payer: Self-pay | Admitting: Internal Medicine

## 2025-02-04 ENCOUNTER — Encounter
